# Patient Record
Sex: Male | Born: 1950 | Race: Black or African American | Hispanic: No | Smoking: Current every day smoker
Health system: Southern US, Community
[De-identification: ages and names within clinical notes are randomized; demographics above are authoritative.]

## PROBLEM LIST (undated history)

## (undated) ENCOUNTER — Emergency Department: Admission: EM | Payer: Medicare HMO | Source: Home / Self Care

## (undated) DIAGNOSIS — J449 Chronic obstructive pulmonary disease, unspecified: Secondary | ICD-10-CM

## (undated) DIAGNOSIS — M1990 Unspecified osteoarthritis, unspecified site: Secondary | ICD-10-CM

## (undated) DIAGNOSIS — I509 Heart failure, unspecified: Secondary | ICD-10-CM

## (undated) DIAGNOSIS — E785 Hyperlipidemia, unspecified: Secondary | ICD-10-CM

## (undated) DIAGNOSIS — I10 Essential (primary) hypertension: Secondary | ICD-10-CM

## (undated) DIAGNOSIS — N289 Disorder of kidney and ureter, unspecified: Secondary | ICD-10-CM

## (undated) HISTORY — PX: REPLACEMENT TOTAL KNEE: SUR1224

## (undated) HISTORY — PX: CORONARY ARTERY BYPASS GRAFT: SHX141

---

## 2011-03-16 ENCOUNTER — Emergency Department: Payer: Self-pay | Admitting: Emergency Medicine

## 2012-01-25 ENCOUNTER — Emergency Department: Payer: Self-pay | Admitting: Emergency Medicine

## 2012-03-28 ENCOUNTER — Encounter: Payer: Self-pay | Admitting: Anesthesiology

## 2012-03-29 ENCOUNTER — Encounter: Payer: Self-pay | Admitting: Anesthesiology

## 2012-04-05 ENCOUNTER — Ambulatory Visit: Payer: Self-pay | Admitting: Anesthesiology

## 2012-04-20 ENCOUNTER — Encounter: Payer: Self-pay | Admitting: Anesthesiology

## 2012-11-02 ENCOUNTER — Inpatient Hospital Stay: Payer: Self-pay | Admitting: Student

## 2012-11-02 DIAGNOSIS — R7989 Other specified abnormal findings of blood chemistry: Secondary | ICD-10-CM

## 2012-11-02 DIAGNOSIS — R079 Chest pain, unspecified: Secondary | ICD-10-CM

## 2012-11-02 DIAGNOSIS — I4892 Unspecified atrial flutter: Secondary | ICD-10-CM

## 2012-11-02 LAB — TROPONIN I
Troponin-I: 1.3 ng/mL — ABNORMAL HIGH
Troponin-I: 1.1 ng/mL — ABNORMAL HIGH

## 2012-11-02 LAB — CK TOTAL AND CKMB (NOT AT ARMC)
CK, Total: 178 U/L (ref 35–232)
CK, Total: 171 U/L (ref 35–232)
CK-MB: 2 ng/mL (ref 0.5–3.6)
CK-MB: 8.4 ng/mL — ABNORMAL HIGH (ref 0.5–3.6)

## 2012-11-02 LAB — COMPREHENSIVE METABOLIC PANEL
Albumin: 3.7 g/dL (ref 3.4–5.0)
Bilirubin,Total: 0.3 mg/dL (ref 0.2–1.0)
Co2: 27 mmol/L (ref 21–32)
Creatinine: 1.44 mg/dL — ABNORMAL HIGH (ref 0.60–1.30)
EGFR (African American): >60
EGFR (Non-African Amer.): 52 — ABNORMAL LOW
Osmolality: 285 (ref 275–301)
SGPT (ALT): 26 U/L (ref 12–78)
Total Protein: 7.3 g/dL (ref 6.4–8.2)

## 2012-11-02 LAB — PROTIME-INR
INR: 1
Prothrombin Time: 13.2 secs (ref 11.5–14.7)

## 2012-11-02 LAB — CBC
HCT: 41.8 % (ref 40.0–52.0)
MCHC: 34.7 g/dL (ref 32.0–36.0)
MCV: 83 fL (ref 80–100)
RBC: 5.01 10*6/uL (ref 4.40–5.90)
RDW: 14.1 % (ref 11.5–14.5)

## 2012-11-03 LAB — BASIC METABOLIC PANEL
Anion Gap: 2 — ABNORMAL LOW (ref 7–16)
BUN: 19 mg/dL — ABNORMAL HIGH (ref 7–18)
Calcium, Total: 8.9 mg/dL (ref 8.5–10.1)
Chloride: 108 mmol/L — ABNORMAL HIGH (ref 98–107)
Co2: 28 mmol/L (ref 21–32)
Creatinine: 1.26 mg/dL (ref 0.60–1.30)
EGFR (African American): >60
Glucose: 116 mg/dL — ABNORMAL HIGH (ref 65–99)
Osmolality: 279 (ref 275–301)

## 2012-11-03 LAB — CBC WITH DIFFERENTIAL/PLATELET
Basophil #: 0 10*3/uL (ref 0.0–0.1)
Eosinophil #: 0.2 10*3/uL (ref 0.0–0.7)
Eosinophil %: 2.7 %
Lymphocyte #: 2 10*3/uL (ref 1.0–3.6)
MCH: 28.9 pg (ref 26.0–34.0)
MCHC: 34.2 g/dL (ref 32.0–36.0)
Monocyte #: 0.4 x10 3/mm (ref 0.2–1.0)
Platelet: 202 10*3/uL (ref 150–440)
RBC: 4.31 10*6/uL — ABNORMAL LOW (ref 4.40–5.90)
RDW: 14.3 % (ref 11.5–14.5)
WBC: 6.6 10*3/uL (ref 3.8–10.6)

## 2013-04-27 ENCOUNTER — Emergency Department: Payer: Self-pay | Admitting: Emergency Medicine

## 2013-04-28 LAB — COMPREHENSIVE METABOLIC PANEL
ALBUMIN: 3.5 g/dL (ref 3.4–5.0)
ALK PHOS: 74 U/L
ALT: 28 U/L (ref 12–78)
AST: 22 U/L (ref 15–37)
Anion Gap: 5 — ABNORMAL LOW (ref 7–16)
BUN: 17 mg/dL (ref 7–18)
Bilirubin,Total: 0.3 mg/dL (ref 0.2–1.0)
CREATININE: 1.07 mg/dL (ref 0.60–1.30)
Calcium, Total: 9.2 mg/dL (ref 8.5–10.1)
Chloride: 110 mmol/L — ABNORMAL HIGH (ref 98–107)
Co2: 25 mmol/L (ref 21–32)
EGFR (African American): >60
Glucose: 132 mg/dL — ABNORMAL HIGH (ref 65–99)
OSMOLALITY: 283 (ref 275–301)
Potassium: 3.6 mmol/L (ref 3.5–5.1)
SODIUM: 140 mmol/L (ref 136–145)
TOTAL PROTEIN: 6.9 g/dL (ref 6.4–8.2)

## 2013-04-28 LAB — TROPONIN I: Troponin-I: <0.02 ng/mL

## 2013-04-28 LAB — CBC
HCT: 39.7 % — AB (ref 40.0–52.0)
HGB: 13.6 g/dL (ref 13.0–18.0)
MCH: 28.6 pg (ref 26.0–34.0)
MCHC: 34.3 g/dL (ref 32.0–36.0)
MCV: 83 fL (ref 80–100)
Platelet: 190 10*3/uL (ref 150–440)
RBC: 4.76 10*6/uL (ref 4.40–5.90)
RDW: 14.1 % (ref 11.5–14.5)
WBC: 7.3 10*3/uL (ref 3.8–10.6)

## 2013-09-12 ENCOUNTER — Emergency Department: Payer: Self-pay | Admitting: Emergency Medicine

## 2013-09-12 LAB — URINALYSIS, COMPLETE
Bilirubin,UR: NEGATIVE
Blood: NEGATIVE
GLUCOSE, UR: NEGATIVE mg/dL (ref 0–75)
KETONE: NEGATIVE
Leukocyte Esterase: NEGATIVE
NITRITE: NEGATIVE
Ph: 6 (ref 4.5–8.0)
RBC,UR: 3 /HPF (ref 0–5)
Specific Gravity: 1.03 (ref 1.003–1.030)
Squamous Epithelial: 1

## 2013-09-12 LAB — CBC WITH DIFFERENTIAL/PLATELET
BASOS ABS: 0.1 10*3/uL (ref 0.0–0.1)
Basophil %: 1.1 %
EOS ABS: 0.2 10*3/uL (ref 0.0–0.7)
Eosinophil %: 3.3 %
HCT: 40.5 % (ref 40.0–52.0)
HGB: 13.7 g/dL (ref 13.0–18.0)
LYMPHS ABS: 2.4 10*3/uL (ref 1.0–3.6)
Lymphocyte %: 35.3 %
MCH: 28.6 pg (ref 26.0–34.0)
MCHC: 33.8 g/dL (ref 32.0–36.0)
MCV: 85 fL (ref 80–100)
Monocyte #: 0.4 x10 3/mm (ref 0.2–1.0)
Monocyte %: 5.9 %
Neutrophil #: 3.7 10*3/uL (ref 1.4–6.5)
Neutrophil %: 54.4 %
Platelet: 207 10*3/uL (ref 150–440)
RBC: 4.78 10*6/uL (ref 4.40–5.90)
RDW: 13.7 % (ref 11.5–14.5)
WBC: 6.7 10*3/uL (ref 3.8–10.6)

## 2013-09-12 LAB — COMPREHENSIVE METABOLIC PANEL
AST: 16 U/L (ref 15–37)
Albumin: 3.7 g/dL (ref 3.4–5.0)
Alkaline Phosphatase: 68 U/L
Anion Gap: 8 (ref 7–16)
BILIRUBIN TOTAL: 0.3 mg/dL (ref 0.2–1.0)
BUN: 18 mg/dL (ref 7–18)
CALCIUM: 9.2 mg/dL (ref 8.5–10.1)
CO2: 24 mmol/L (ref 21–32)
Chloride: 109 mmol/L — ABNORMAL HIGH (ref 98–107)
Creatinine: 1.29 mg/dL (ref 0.60–1.30)
EGFR (Non-African Amer.): 59 — ABNORMAL LOW
Glucose: 176 mg/dL — ABNORMAL HIGH (ref 65–99)
Osmolality: 287 (ref 275–301)
Potassium: 3.4 mmol/L — ABNORMAL LOW (ref 3.5–5.1)
SGPT (ALT): 28 U/L (ref 12–78)
Sodium: 141 mmol/L (ref 136–145)
Total Protein: 7 g/dL (ref 6.4–8.2)

## 2013-10-01 ENCOUNTER — Emergency Department: Payer: Self-pay | Admitting: Emergency Medicine

## 2014-01-08 ENCOUNTER — Emergency Department: Payer: Self-pay | Admitting: Emergency Medicine

## 2014-01-08 LAB — COMPREHENSIVE METABOLIC PANEL
Albumin: 3.7 g/dL (ref 3.4–5.0)
Alkaline Phosphatase: 68 U/L
Anion Gap: 8 (ref 7–16)
BUN: 16 mg/dL (ref 7–18)
Bilirubin,Total: 0.4 mg/dL (ref 0.2–1.0)
CO2: 25 mmol/L (ref 21–32)
Calcium, Total: 8.9 mg/dL (ref 8.5–10.1)
Chloride: 111 mmol/L — ABNORMAL HIGH (ref 98–107)
Creatinine: 1.17 mg/dL (ref 0.60–1.30)
EGFR (Non-African Amer.): >60
GLUCOSE: 100 mg/dL — AB (ref 65–99)
Osmolality: 288 (ref 275–301)
Potassium: 3.6 mmol/L (ref 3.5–5.1)
SGOT(AST): 17 U/L (ref 15–37)
SGPT (ALT): 26 U/L
SODIUM: 144 mmol/L (ref 136–145)
TOTAL PROTEIN: 6.7 g/dL (ref 6.4–8.2)

## 2014-01-08 LAB — CBC
HCT: 40.2 % (ref 40.0–52.0)
HGB: 13.2 g/dL (ref 13.0–18.0)
MCH: 28 pg (ref 26.0–34.0)
MCHC: 32.8 g/dL (ref 32.0–36.0)
MCV: 85 fL (ref 80–100)
Platelet: 196 10*3/uL (ref 150–440)
RBC: 4.71 10*6/uL (ref 4.40–5.90)
RDW: 14.1 % (ref 11.5–14.5)
WBC: 7.7 10*3/uL (ref 3.8–10.6)

## 2014-01-08 LAB — URINALYSIS, COMPLETE
BACTERIA: NONE SEEN
BILIRUBIN, UR: NEGATIVE
Blood: NEGATIVE
Glucose,UR: NEGATIVE mg/dL (ref 0–75)
Ketone: NEGATIVE
LEUKOCYTE ESTERASE: NEGATIVE
NITRITE: NEGATIVE
PH: 5 (ref 4.5–8.0)
PROTEIN: NEGATIVE
RBC,UR: <1 /HPF (ref 0–5)
SPECIFIC GRAVITY: 1.023 (ref 1.003–1.030)

## 2014-02-02 ENCOUNTER — Encounter: Payer: Self-pay | Admitting: Family Medicine

## 2014-02-17 ENCOUNTER — Encounter: Payer: Self-pay | Admitting: Family Medicine

## 2014-03-11 ENCOUNTER — Emergency Department: Payer: Self-pay | Admitting: Emergency Medicine

## 2014-03-20 ENCOUNTER — Encounter: Payer: Self-pay | Admitting: Family Medicine

## 2014-07-10 NOTE — Consult Note (Signed)
General Aspect 64 year old male with significant past medical history of CRI, OSA,  hypertension, hyperlipidemia, COPD, history of DVT in the past, chronic chest pain per [facility] notes, who presents with palpitations and heart racing.Cardiology was consulted for atrial flutter.   He reports sx started last night with palps and fluttering, also some chest pain.   In the ER, the patient was found to be in atrial flutter  with RVR, with heart rate in the 150s. He was given IV metoprolol then started on IV Cardizem in the ED, with improvement of his heart rate.After a few hours on the drip in the ED, the patient converted back to normal sinus rhythm.   The patient was recently at [facility] for complaints of chest pain, where he had his troponin cycled, which was negative. The patient's last cardiac catheterization was in December 2012, with no significant CAD.  he received morphine in the ER. echo done in [facility], which showed ejection fraction 60% to 16% with diastolic dysfunction.   Present Illness . PAST SURGICAL HISTORY:  1. Bilateral total knee arthroplasty.  2. Coronary angioplasty with stent placement.   SOCIAL HISTORY: The patient smokes, 1 pack lasting over 4 days. No alcohol. No illicit drug use.   FAMILY HISTORY: Significant for diabetes and coronary artery disease.   Physical Exam:  GEN well developed, well nourished, no acute distress   HEENT hearing intact to voice, moist oral mucosa   NECK supple    RESP normal resp effort  clear BS    CARD Regular rate and rhythm  No murmur    ABD denies tenderness  soft    LYMPH negative neck   EXTR negative edema   SKIN normal to palpation   NEURO motor/sensory function intact   PSYCH alert, A+O to time, place, person, good insight   Review of Systems:  Subjective/Chief Complaint chest pain   Skin: No Complaints    ENT: No Complaints    Eyes: No Complaints    Neck: No Complaints    Respiratory: No Complaints     Cardiovascular: Chest pain or discomfort    Gastrointestinal: burping, burning, epigastric pain better with drinking water and peppermint    Genitourinary: No Complaints    Vascular: No Complaints    Musculoskeletal: No Complaints    Neurologic: No Complaints    Hematologic: No Complaints    Endocrine: No Complaints    Psychiatric: No Complaints    Review of Systems: All other systems were reviewed and found to be negative    Medications/Allergies Reviewed Medications/Allergies reviewed      Depression:    CHF:    GERD - Esophageal Reflux:    Asthma:    DVT - Deep Vein Thrombosis:    Hypertension:    irreg HR:    chronic arthritis:    card stent:    bilat TKR:        Admit Diagnosis:   ATRIAL FLUTTER: Onset Date: 02-Nov-2012, Status: Active, Description: ATRIAL FLUTTER  Home Medications: Medication Instructions Status  cyclobenzaprine 10 mg oral tablet 1 tab(s) orally 3 times a day, As Needed Active  atorvastatin 80 mg oral tablet 1 tab(s) orally once a day (at bedtime) Active  Ventolin HFA CFC free 90 mcg/inh inhalation aerosol 2 puff(s) inhaled 4 times a day Active  Vitamin D3 50,000 intl units oral capsule 1 cap(s) orally once a month Active  multivitamin  Active  Symbicort 80 mcg-4.5 mcg/inh inhalation aerosol 2 puff(s) inhaled 2 times  a day Active  hydrochlorothiazide-losartan 12.5 mg-50 mg oral tablet 1 tab(s) orally once a day Active  aspirin 81 mg oral tablet 1 tab(s) orally once a day Active  isosorbide mononitrate 30 mg oral tablet, extended release 1 tab(s) orally once a day (in the morning) Active  Nicoderm C-Q 21 mg/24 hr transdermal film, extended release 1 patch transdermal once a day Active  Cymbalta 60 mg oral delayed release capsule 1 cap(s) orally once a day Active  furosemide 40 mg oral tablet 1 tab(s) orally once a day Active  gentamicin ophthalmic 0.3% ophthalmic solution 1 drop(s) to each affected eye 4 times a day Active  Klor-Con  M20 20 mEq oral tablet, extended release 1 tab(s) orally 2 times a day Active  Metoprolol Tartrate 25 mg oral tablet 1 tab(s) orally 2 times a day Active  oxyCODONE 5 mg oral tablet 1 tab(s) orally every 6 hours Active  tamsulosin 0.4 mg oral capsule 1 cap(s) orally once a day Active  simvastatin 10 mg oral tablet 1 tab(s) orally once a day (at bedtime) Active  AndroGel 20.25 mg/1.25 g (1.62%) transdermal gel  transdermal  Active  Viagra 50 mg oral tablet 1 tab(s) orally once a day Active  Fish Oil  orally  Active   Lab Results:  Hepatic:  16-Aug-14 00:43   Bilirubin, Total 0.3  Alkaline Phosphatase 77  SGPT (ALT) 26  SGOT (AST) 22  Total Protein, Serum 7.3  Albumin, Serum 3.7  Routine Chem:  16-Aug-14 00:43   B-Type Natriuretic Peptide ([facility])  500 (Result(s) reported on 02 Nov 2012 at 01:23AM.)  Glucose, Serum  129  BUN  19  Creatinine (comp)  1.44  Sodium, Serum 141  Potassium, Serum  3.3  Chloride, Serum 107  CO2, Serum 27  Calcium (Total), Serum 9.0  Osmolality (calc) 285  eGFR (African American) >60  eGFR (Non-African American)  52 (eGFR values <52m/min/1.73 m2 may be an indication of chronic kidney disease (CKD). Calculated eGFR is useful in patients with stable renal function. The eGFR calculation will not be reliable in acutely ill patients when serum creatinine is changing rapidly. It is not useful in  patients on dialysis. The eGFR calculation may not be applicable to patients at the low and high extremes of body sizes, pregnant women, and vegetarians.)  Anion Gap 7  Cardiac:  16-Aug-14 00:43   CK, Total 186  CPK-MB, Serum 2.0 (Result(s) reported on 02 Nov 2012 at 01:42AM.)  Troponin I 0.03 (0.00-0.05 0.05 ng/mL or less: NEGATIVE  Repeat testing in 3-6 hrs  if clinically indicated. >0.05 ng/mL: POTENTIAL  MYOCARDIAL INJURY. Repeat  testing in 3-6 hrs if  clinically indicated. NOTE: An increase or decrease  of 30% or more on serial  testing suggests a   clinically important change)    09:08   Troponin I  1.30 (0.00-0.05 0.05 ng/mL or less: NEGATIVE  Repeat testing in 3-6 hrs  if clinically indicated. >0.05 ng/mL: POTENTIAL  MYOCARDIAL INJURY. Repeat  testing in 3-6 hrs if  clinically indicated. NOTE: An increase or decrease  of 30% or more on serial  testing suggests a  clinically important change)  Routine Coag:  16-Aug-14 00:43   Prothrombin 13.2  INR 1.0 (INR reference interval applies to patients on anticoagulant therapy. A single INR therapeutic range for coumarins is not optimal for all indications; however, the suggested range for most indications is 2.0 - 3.0. Exceptions to the INR Reference Range may include: Prosthetic heart valves, acute myocardial  infarction, prevention of myocardial infarction, and combinations of aspirin and anticoagulant. The need for a higher or lower target INR must be assessed individually. Reference: The Pharmacology and Management of the Vitamin K  antagonists: the seventh ACCP Conference on Antithrombotic and Thrombolytic Therapy. LKGMW.1027 Sept:126 (3suppl): N9146842. A HCT value >55% may artifactually increase the PT.  In one study,  the increase was an average of 25%. Reference:  "Effect on Routine and Special Coagulation Testing Values of Citrate Anticoagulant Adjustment in Patients with High HCT Values." American Journal of Clinical Pathology 2006;126:400-405.)  Routine Hem:  16-Aug-14 00:43   WBC (CBC) 8.5  RBC (CBC) 5.01  Hemoglobin (CBC) 14.5  Hematocrit (CBC) 41.8  Platelet Count (CBC) 232 (Result(s) reported on 02 Nov 2012 at 12:53AM.)  MCV 83  MCH 28.9  MCHC 34.7  RDW 14.1   EKG:  Interpretation EKG shows atrial flutter with ventricular rate of 152 bpm, no significant ST or T wave changes   Radiology Results: XRay:    16-Aug-14 00:54, Chest Portable Single View  Chest Portable Single View   REASON FOR EXAM:    chest pain  COMMENTS:       PROCEDURE: DXR - DXR  PORTABLE CHEST SINGLE VIEW  - Nov 02 2012 12:54AM     RESULT: Comparison is made to study of 03/16/2011. The cardiac monitoring   electrodes present. The lungs are clear. The heart and pulmonary vessels   are normal. The bony and mediastinal structures are unremarkable. There   is no effusion. There is no pneumothorax or evidence of congestive   failure.    IMPRESSION:  No acute cardiopulmonary disease.    Dictation Site: 6    Verified By: Sundra Aland, M.D., MD    No Known Allergies:    Impression 64 year old male with significant past medical history of CRI, OSA,  hypertension, hyperlipidemia, COPD, history of DVT in the past, chronic chest pain per [facility] notes, who presents with palpitations and heart racing.Cardiology was consulted for atrial flutter.   1) Atrial flutter Suspect triggered by chest pain in setting of low potassium Now in NSR would continue metoprolol if heart rate tolerates, hold parameters Hold on anticoagulation for now and monitor echo pending  2) Chest/epigastric pain chronic issue, dating back for years,  atypical in nature, better in the past with drinking, peppermint this was the reason for cardiac cath in 12/12 at [facility] (no CAD) Never had EGD or GI workup per the patient Current having  pain, moving around left side of chest,  --Wil give GI cocktail, start PPI --Add D-dimer (rule out PE given hx of DVT)  3) Elevated cardiac enz: Demand ischemia from atrial flutter with rate 150 on arrival for hours Previous cardiac cath no no significant CAD No anticoagulation at this time will repeat EKG now, monitor enz If cardiac enz continue to trend upwards with no atrial flutter, will need repeat cath  4) OSA:  5)HTN: Stable, low potassium from lasix   Electronic Signatures: Ida Rogue (MD)  (Signed 16-Aug-14 12:34)  Authored: General Aspect/Present Illness, History and Physical Exam, Review of System, Past Medical History, Health Issues, Home  Medications, Labs, EKG , Radiology, Allergies, Impression/Plan   Last Updated: 16-Aug-14 12:34 by Ida Rogue (MD)

## 2014-07-10 NOTE — H&P (Signed)
PATIENT NAME:  Albert Johnson, Albert Johnson MR#:  [id number] DATE OF BIRTH:  04/18/1950  DATE OF ADMISSION:  11/02/2012  REFERRING PHYSICIAN: Doralee Albino. Ladona Ridgel, MD  PRIMARY CARE PHYSICIAN: [facility].   CHIEF COMPLAINT: Palpitations and chest pain.   HISTORY OF PRESENT ILLNESS: This is a 64 year old male with significant past medical history of coronary artery disease, hypertension, hyperlipidemia, obstructive sleep apnea, COPD, history of DVT in the past, who presents with above-mentioned complaint. The patient reports he had palpitations and heart racing that started this evening. As well, he has complaint of some chest pain. The patient was found to be in atrial flutter and atrial fibrillation with RVR, with heart rate in the 150s. He was given IV metoprolol without much help, so he was started on IV Cardizem in the ED, with improvement of his heart rate, where it was rate controlled, and after a few hours on the drip in the ED, the patient converted back to normal sinus rhythm. As well, he did have some bradycardia when he had his Cardizem drip started. The patient was recently at [facility] for complaints of chest pain, where he had his troponin cycled, which was negative. The patient's last cardiac catheterization was in December 2012, which was without any evidence of angiographic coronary artery disease. Currently, the patient reports he is chest pain-free after receiving the morphine. He received 324 of aspirin in ED. First troponin is negative. The patient had last echo done in [facility], which showed ejection fraction 60% to 65% and with diastolic dysfunction. Currently, the patient is chest pain-free after receiving the morphine. He did have some complaints of reproducible chest pain upon palpation, but he reports this is different than the chest pain he presented with. Hospitalist service was requested to admit the patient for further evaluation for his new-onset atrial fibrillation, as well for his  chest pain.   PAST MEDICAL HISTORY:  1. Hypertension.  2. Benign prostatic hypertrophy.  3. Testicular hypofunction.  4. Obstructive sleep apnea, on CPAP.  5. Chronic kidney disease.  6. Gastroesophageal reflux disease.  7. Chronic obstructive pulmonary disease.  8. Depression.  9. History of DVT.  10. Coronary artery disease.   PAST SURGICAL HISTORY:  1. Bilateral total knee arthroplasty.  2. Coronary angioplasty with stent placement.   SOCIAL HISTORY: The patient smokes, 1 pack lasting over 4 days. No alcohol. No illicit drug use.   FAMILY HISTORY: Significant for diabetes and coronary artery disease.   HOME MEDICATIONS:  1. Aspirin 81 mg daily.  2. Atorvastatin 80 mg at bedtime.  3. Isosorbide mononitrate 30 mg oral daily.  4. Nicotine patch daily.  5. Sublingual nitroglycerin as needed.  6. Albuterol as needed.  7. Vitamin D3 50,000 units capsule.  8. Flexeril as needed.  9. Cymbalta 60 mg oral daily.  10. Lasix 40 mg oral daily.  11. Ophthalmic solution gentamicin.  12. Potassium 20 mEq oral daily.  13. Losartan/hydrochlorothiazide 12.5/50 mg oral daily.  14. Metoprolol tartrate 25 mg oral daily.  15. Multivitamin 1 tablet oral daily.  16. Oxycodone 5 mg immediate release as needed.  17. Symbicort 80/4.5 two puffs b.i.d.  18. Tamsulosin 0.4 mg oral daily.   REVIEW OF SYSTEMS:  CONSTITUTIONAL: The patient denies fever, chills, fatigue, weakness, weight gain, weight loss.  EYES: Denies blurry vision, double vision, inflammation, glaucoma.  ENT: Denies tinnitus, ear pain, hearing loss, epistaxis or discharge.  RESPIRATORY: Denies cough, wheezing, hemoptysis, dyspnea. Has history of COPD.  CARDIOVASCULAR: Complains of chest  pain, currently chest pain-free. Denies edema or syncope. Complains of palpitations and heart racing. GASTROINTESTINAL: Denies nausea, vomiting, diarrhea, abdominal pain, jaundice, rectal bleed.  GENITOURINARY: Denies dysuria, hematuria or renal  colic.  ENDOCRINE: Denies polyuria, polydipsia, heat or cold intolerance.  HEMATOLOGY: Denies anemia, easy bruising, bleeding diathesis.  INTEGUMENTARY: Denies acne, rash or skin lesions.  MUSCULOSKELETAL: Denies any gout, cramps. Has history of arthritis.  NEUROLOGIC: Denies CVA, TIA, ataxia, dementia, headache.  PSYCHIATRIC: Denies anxiety, schizophrenia, nervousness, substance or alcohol abuse or bipolar disorder.   PHYSICAL EXAMINATION:  VITAL SIGNS: Temperature 98.8, pulse 61, respiratory rate 16, blood pressure 104/54, saturating 100% on oxygen.  GENERAL: An obese male who looks comfortable in bed, in no apparent distress.  HEENT: Head atraumatic, normocephalic. Pupils equally reactive to light. Pink conjunctivae. Anicteric sclerae. Moist oral mucosa.  NECK: Supple. No thyromegaly. No JVD.  CHEST: Good air entry bilaterally. No wheezing, rales, rhonchi.  CARDIOVASCULAR: S1, S2 heard. No rubs, murmurs or gallops.  ABDOMEN: Obese, soft, nontender, nondistended. Bowel sounds present.  EXTREMITIES: No edema. No clubbing. No cyanosis. Pulses felt bilaterally +2.  SKIN: Normal skin turgor. Warm and dry. No rash.  PSYCHIATRIC: Appropriate affect. Awake and alert x3. Intact judgment and insight.  NEUROLOGIC: Cranial nerves grossly intact. Motor 5 out of 5. No focal deficits.  LYMPHATICS: No cervical or supraclavicular lymphadenopathy.   PERTINENT LABORATORY DATA: Glucose 129. BNP 500. BUN 19, creatinine 1.44, sodium 141, potassium 3.3, chloride 107, CO2 27. Troponin 0.03. White blood cell 8.5, hemoglobin 14.5, hematocrit 41.8, platelets 232. INR is 1.   ELECTROCARDIOGRAM: Showing atrial flutter with 152 beats per minute.   ASSESSMENT AND PLAN:  1. Chest pain. The patient was given 324 mg of aspirin, was given nitro paste. Currently, he is chest pain-free. The patient will be admitted to telemetry unit. Will cycle his cardiac enzymes. He is already on aspirin and statin, beta blockers and  losartan.  2. Atrial flutter. The patient subsequently converted back to normal sinus rhythm, off Cardizem drip. Will consult cardiology. Will continue to monitor on telemetry and continue to cycle his cardiac enzymes. Would like to obtain cardiac evaluation to see if the patient is a candidate for anticoagulation or if he needs to be started on any other medication for his atrial flutter.  3. Obstructive sleep apnea. Continue with CPAP.  4. Hypertension. Blood pressure acceptable. Continue home medications. Was on the lower side secondary to his Cardizem drip, which we discontinued right now.  5. Tobacco abuse. The patient was counseled for five minutes. Will continue on NicoDerm patch.  6. Chronic kidney disease. Will monitor closely. Will avoid nephrotoxic medication.  7. Chronic obstructive pulmonary disease. Does not have any wheezing. Appears to be compensated. Will continue him on his home medications, mainly Symbicort and albuterol.  8. Coronary artery disease. The patient is on aspirin, statin, beta blockers, Imdur and losartan. 9. Deep vein thrombosis prophylaxis. Subcutaneous heparin.   CODE STATUS: Full code.   TOTAL TIME SPENT ON ADMISSION AND PATIENT CARE: 60 minutes.   ____________________________ Starleen Armsawood S. Randie Tallarico, MD dse:OSi D: 11/02/2012 07:11:12 ET T: 11/02/2012 07:25:32 ET JOB#: [id number]  cc: Starleen Armsawood S. Waldemar Siegel, MD, <Dictator> Marshae Azam Teena IraniS Hayven Fatima MD ELECTRONICALLY SIGNED 11/09/2012 12:10

## 2014-07-10 NOTE — Discharge Summary (Signed)
PATIENT NAME:  Juanetta BeetsSNIPES, Dreon MR#:  [id number] DATE OF BIRTH:  [birth year]  DATE OF ADMISSION:  11/02/2012 DATE OF DISCHARGE:  11/04/2012  CONSULTANTS: Dr. Dossie Arbourim Gollan from cardiology.   [facility]   CHIEF COMPLAINT: Palpitations.   DISCHARGE DIAGNOSES:  1.  Atrial flutter, likely in the setting of hypokalemia, now resolved.  2.  Hypertension.  3.  Hyperkalemia.  4.  Chest/epigastric pain, likely gastrointestinal related.  5.  Elevated cardiac enzymes, likely demand ischemia from atrial flutter with tachycardia.  6.  Obstructive sleep apnea.  7.  Hypertension.  8.  History of chronic obstructive pulmonary disease.  9.  History of deep vein thrombosis in the past.  10. Benign prostatic hypertrophy.  11. Testicular hypofunction.  12. Chronic kidney disease.  13. Gastroesophageal reflux disease.  14. Depression.  15. History of coronary artery disease.   DISCHARGE MEDICATIONS: Fish oil orally as previously used, cyclobenzaprine 10 mg 1 tab 3 times a day as needed, Ventolin 2 puffs 4 times a day, vitamin D3 50,000 international units once a month, Symbicort 80/4.5 mcg 2 puffs 2 times a day, hydrochlorothiazide/losartan 12.5/50 mg 1 tab once a day, aspirin 81 mg daily, Nicoderm 21 mg per day extended release, 1 patch transdermally daily, Cymbalta 60 mg daily, furosemide 40 mg daily, gentamicin ophthalmic 0.3% solution 1 drop to each affected eye 4 times a day, tamsulosin 0.4 mg daily, simvastatin 10 mg daily, AndroGel 20.25 per 1.25 grams transdermally and Imdur 30 mg in the morning.   DIET: Low sodium, low fat, low cholesterol.   ACTIVITY: As tolerated.   DISCHARGE FOLLOWUP: Please follow with PCP within 1 to 2 weeks.   DISPOSITION: Home.   HISTORY OF PRESENT ILLNESS AND HOSPITAL COURSE: For full details of H and P, please see the dictation on 08/16 by Dr. Randol KernElgergawy, but briefly this is an obese, 64 year old, African American male with history of  CAD, hypertension, hyperlipidemia, COPD, obstructive sleep apnea, who presented for palpitations. In the ER, he was found to be in fib. and flutter with rapid ventricular rate with heart rate of 150s and was given IV metoprolol and then Cardizem. He did convert to normal sinus rhythm and was admitted to telemetry. Of note, he has had a cardiac catheterization in 02/2011, which was without any evidence of  CAD per H and P. He was admitted to the hospitalist service and telemetry. Troponins were cycled and a second Troponin did jump. This was likely tachycardia driven. The patient was seen by Dr. Mariah MillingGollan from cardiology. Atrial flutter did not come back and he was monitored on telemetry without further tachycardia. He was on metoprolol 25 mg b.i.d. at discharge. He has hypokalemia, which per cardiology likely triggered the A. flutter was repleted and he was discharged on potassium. His blood pressure remained stable. His initial BUN was on 19, creatinine of 1.44 and on 08/17 creatinine was 1.26. He did not have any significant leukocytosis on arrival nor did he have a fever. His d-dimer was checked and it was 0.45, which made a DVT or PE less likely. He did have some chest/epigastric pain, which appears to be chronic dating back for some time. This is atypical and as he has had no EGD or GI workup in the past, he was instructed to follow up with his Doctor and undergo further evaluation for this. He was maintained on CPAP. At this time, he will be discharged to outpatient followup. On the day of discharge, his temperature  was 98, pulse rate was 55, respiratory rate was 18, blood pressure was 152/82 and his saturation was 97% on room air. Generally, the patient is an obese, African American male lying in bed, but has been ambulating in the halls without any issues. Heart sounds appear to be her regular, but bradycardic. No murmurs, rubs or gallops. Lung sounds are clear.  The abdomen is benign without any significant  tenderness and normoactive. He does not have any lower extremity edema. At this time, he will be discharged with outpatient followup.   TOTAL TIME SPENT: 35 minutes.  ____________________________ Krystal Eaton, MD sa:aw D: 11/04/2012 14:52:36 ET T: 11/04/2012 15:37:03 ET JOB#: [id number]  cc: Krystal Eaton, MD, <Dictator> Krystal Eaton MD ELECTRONICALLY SIGNED 11/10/2012 12:44

## 2015-09-30 ENCOUNTER — Emergency Department (HOSPITAL_COMMUNITY): Payer: Medicare HMO

## 2015-09-30 ENCOUNTER — Encounter (HOSPITAL_COMMUNITY): Payer: Self-pay | Admitting: *Deleted

## 2015-09-30 ENCOUNTER — Emergency Department (HOSPITAL_COMMUNITY)
Admission: EM | Admit: 2015-09-30 | Discharge: 2015-10-01 | Disposition: A | Discharge status: Home / Self Care | Payer: Medicare HMO | Attending: Emergency Medicine | Admitting: Emergency Medicine

## 2015-09-30 DIAGNOSIS — Z951 Presence of aortocoronary bypass graft: Secondary | ICD-10-CM | POA: Insufficient documentation

## 2015-09-30 DIAGNOSIS — I110 Hypertensive heart disease with heart failure: Secondary | ICD-10-CM | POA: Insufficient documentation

## 2015-09-30 DIAGNOSIS — J449 Chronic obstructive pulmonary disease, unspecified: Secondary | ICD-10-CM | POA: Diagnosis not present

## 2015-09-30 DIAGNOSIS — R079 Chest pain, unspecified: Secondary | ICD-10-CM | POA: Diagnosis present

## 2015-09-30 DIAGNOSIS — I509 Heart failure, unspecified: Secondary | ICD-10-CM | POA: Diagnosis not present

## 2015-09-30 DIAGNOSIS — Z96659 Presence of unspecified artificial knee joint: Secondary | ICD-10-CM | POA: Diagnosis not present

## 2015-09-30 DIAGNOSIS — F17210 Nicotine dependence, cigarettes, uncomplicated: Secondary | ICD-10-CM | POA: Diagnosis not present

## 2015-09-30 DIAGNOSIS — R05 Cough: Secondary | ICD-10-CM

## 2015-09-30 DIAGNOSIS — R059 Cough, unspecified: Secondary | ICD-10-CM

## 2015-09-30 DIAGNOSIS — R748 Abnormal levels of other serum enzymes: Secondary | ICD-10-CM | POA: Diagnosis not present

## 2015-09-30 DIAGNOSIS — R7989 Other specified abnormal findings of blood chemistry: Secondary | ICD-10-CM

## 2015-09-30 HISTORY — DX: Chronic obstructive pulmonary disease, unspecified: J44.9

## 2015-09-30 HISTORY — DX: Unspecified osteoarthritis, unspecified site: M19.90

## 2015-09-30 HISTORY — DX: Hyperlipidemia, unspecified: E78.5

## 2015-09-30 HISTORY — DX: Heart failure, unspecified: I50.9

## 2015-09-30 HISTORY — DX: Essential (primary) hypertension: I10

## 2015-09-30 HISTORY — DX: Disorder of kidney and ureter, unspecified: N28.9

## 2015-09-30 LAB — COMPREHENSIVE METABOLIC PANEL
ALT: 23 U/L (ref 17–63)
AST: 25 U/L (ref 15–41)
Albumin: 3.6 g/dL (ref 3.5–5.0)
Alkaline Phosphatase: 67 U/L (ref 38–126)
Anion gap: 7 (ref 5–15)
BUN: 14 mg/dL (ref 6–20)
CHLORIDE: 105 mmol/L (ref 101–111)
CO2: 22 mmol/L (ref 22–32)
Calcium: 8.8 mg/dL — ABNORMAL LOW (ref 8.9–10.3)
Creatinine, Ser: 1.41 mg/dL — ABNORMAL HIGH (ref 0.61–1.24)
GFR calc Af Amer: 60 mL/min — ABNORMAL LOW (ref >60)
GFR, EST NON AFRICAN AMERICAN: 52 mL/min — AB (ref >60)
Glucose, Bld: 115 mg/dL — ABNORMAL HIGH (ref 65–99)
POTASSIUM: 3.5 mmol/L (ref 3.5–5.1)
Sodium: 134 mmol/L — ABNORMAL LOW (ref 135–145)
TOTAL PROTEIN: 6.3 g/dL — AB (ref 6.5–8.1)
Total Bilirubin: 1.1 mg/dL (ref 0.3–1.2)

## 2015-09-30 LAB — CBC
HEMATOCRIT: 42 % (ref 39.0–52.0)
HEMOGLOBIN: 13.9 g/dL (ref 13.0–17.0)
MCH: 27.9 pg (ref 26.0–34.0)
MCHC: 33.1 g/dL (ref 30.0–36.0)
MCV: 84.2 fL (ref 78.0–100.0)
Platelets: 185 10*3/uL (ref 150–400)
RBC: 4.99 MIL/uL (ref 4.22–5.81)
RDW: 14.2 % (ref 11.5–15.5)
WBC: 5.5 10*3/uL (ref 4.0–10.5)

## 2015-09-30 LAB — URINALYSIS, ROUTINE W REFLEX MICROSCOPIC
Bilirubin Urine: NEGATIVE
GLUCOSE, UA: NEGATIVE mg/dL
Hgb urine dipstick: NEGATIVE
KETONES UR: NEGATIVE mg/dL
LEUKOCYTES UA: NEGATIVE
Nitrite: NEGATIVE
PH: 5.5 (ref 5.0–8.0)
Protein, ur: 30 mg/dL — AB
SPECIFIC GRAVITY, URINE: 1.026 (ref 1.005–1.030)

## 2015-09-30 LAB — URINE MICROSCOPIC-ADD ON: RBC / HPF: NONE SEEN RBC/hpf (ref 0–5)

## 2015-09-30 LAB — LIPASE, BLOOD: LIPASE: 31 U/L (ref 11–51)

## 2015-09-30 LAB — TROPONIN I: TROPONIN I: 0.03 ng/mL — AB (ref <0.03)

## 2015-09-30 MED ORDER — ALBUTEROL SULFATE (2.5 MG/3ML) 0.083% IN NEBU
2.5000 mg | INHALATION_SOLUTION | Freq: Once | RESPIRATORY_TRACT | Status: AC
Start: 2015-09-30 — End: 2015-09-30
  Administered 2015-09-30: 2.5 mg via RESPIRATORY_TRACT
  Filled 2015-09-30: qty 3

## 2015-09-30 MED ORDER — ALBUTEROL SULFATE (2.5 MG/3ML) 0.083% IN NEBU
INHALATION_SOLUTION | RESPIRATORY_TRACT | Status: AC
  Filled 2015-09-30: qty 3

## 2015-09-30 MED ORDER — HYDROCODONE-HOMATROPINE 5-1.5 MG/5ML PO SYRP
5.0000 mL | ORAL_SOLUTION | Freq: Once | ORAL | Status: AC
  Administered 2015-09-30: 5 mL via ORAL
  Filled 2015-09-30: qty 5

## 2015-09-30 MED ORDER — IPRATROPIUM BROMIDE 0.02 % IN SOLN
RESPIRATORY_TRACT | Status: AC
  Filled 2015-09-30: qty 2.5

## 2015-09-30 MED ORDER — SODIUM CHLORIDE 0.9 % IV SOLN
Freq: Once | INTRAVENOUS | Status: AC
  Administered 2015-09-30: 22:00:00 via INTRAVENOUS

## 2015-09-30 MED ORDER — IPRATROPIUM BROMIDE 0.02 % IN SOLN
0.5000 mg | Freq: Once | RESPIRATORY_TRACT | Status: AC
  Administered 2015-09-30: 0.5 mg via RESPIRATORY_TRACT
  Filled 2015-09-30: qty 2.5

## 2015-09-30 NOTE — ED Provider Notes (Signed)
CSN: [id number]     Arrival date & time 09/30/15  1831 History   First MD Initiated Contact with Patient 09/30/15 2105     Chief Complaint  Patient presents with  . Abdominal Pain  . Emesis  . Cough     (Consider location/radiation/quality/duration/timing/severity/associated sxs/prior Treatment) HPI Comments: 65 year old male with a history of COPD, hypertension, hyperlipidemia, CAD s/p CABG and CHF presents to the emergency department for evaluation of a cough. Patient states that he had difficulty sleeping all night last night because of his cough which kept him awake. He noticed that the cough persisted throughout the day today and he feels as though it is getting worse. Cough was initially productive of clear sputum. He now believes that the sputum is yellow in color. He does report noncompliance with his Symbicort and albuterol for COPD. He denies any fevers or recent sick contacts. No nasal congestion or rhinorrhea. No medications taken prior to arrival to try and alleviate cough. Patient has had 3 episodes of posttussive emesis as a result of filing coughing spells. He complains of some chest pain which is present with coughing and deep breathing. He also has some mild abdominal pain with forceful coughing. No associated diarrhea. No syncope. No hemoptysis.  The history is provided by the patient. No language interpreter was used.    Past Medical History  Diagnosis Date  . Hypertension   . Hyperlipidemia   . Arthritis   . COPD (chronic obstructive pulmonary disease) (HCC)   . Renal disorder   . CHF (congestive heart failure) [facility])    Past Surgical History  Procedure Laterality Date  . Replacement total knee    . Coronary artery bypass graft     No family history on file. Social History  Substance Use Topics  . Smoking status: Current Every Day Smoker -- 0.50 packs/day    Types: Cigarettes  . Smokeless tobacco: None  . Alcohol Use: No    Review of Systems   Constitutional: Negative for fever.  HENT: Negative for congestion and rhinorrhea.   Respiratory: Positive for cough and shortness of breath.   Cardiovascular: Positive for chest pain.  Gastrointestinal: Positive for vomiting and abdominal pain. Negative for nausea.  Neurological: Negative for syncope.  Ten systems reviewed and are negative for acute change, except as noted in the HPI.    Allergies  Review of patient's allergies indicates no known allergies.  Home Medications   Prior to Admission medications   Not on File   BP 160/81 mmHg  Pulse 67  Temp(Src) 99.5 F (37.5 C) (Oral)  Resp 21  Ht 6' (1.829 m)  Wt 129.275 kg  BMI 38.64 kg/m2  SpO2 93%   Physical Exam  Constitutional: He is oriented to person, place, and time. He appears well-developed and well-nourished. No distress.  Nontoxic appearing  HENT:  Head: Normocephalic and atraumatic.  Eyes: Conjunctivae and EOM are normal. No scleral icterus.  Neck: Normal range of motion.  Cardiovascular: Normal rate, regular rhythm and intact distal pulses.   Pulmonary/Chest: Effort normal. No respiratory distress. He has no wheezes.  Respirations even and unlabored. Dry, nonproductive cough appreciated. No tachypnea or dyspnea. No wheezing, rales, or rhonchi.  Musculoskeletal: Normal range of motion.  Trace pitting edema in BLE  Neurological: He is alert and oriented to person, place, and time. He exhibits normal muscle tone. Coordination normal.  GCS 15. Patient moving all extremities.  Skin: Skin is warm and dry. No rash noted. He is not  diaphoretic. No erythema. No pallor.  Psychiatric: He has a normal mood and affect. His behavior is normal.  Nursing note and vitals reviewed.   ED Course  Procedures (including critical care time) Labs Review Labs Reviewed  COMPREHENSIVE METABOLIC PANEL - Abnormal; Notable for the following:    Sodium 134 (*)    Glucose, Bld 115 (*)    Creatinine, Ser 1.41 (*)    Calcium 8.8  (*)    Total Protein 6.3 (*)    GFR calc non Af Amer 52 (*)    GFR calc Af Amer 60 (*)    All other components within normal limits  URINALYSIS, ROUTINE W REFLEX MICROSCOPIC (NOT AT [facility]) - Abnormal; Notable for the following:    Color, Urine AMBER (*)    Protein, ur 30 (*)    All other components within normal limits  URINE MICROSCOPIC-ADD ON - Abnormal; Notable for the following:    Squamous Epithelial / LPF 0-5 (*)    Bacteria, UA FEW (*)    Casts HYALINE CASTS (*)    All other components within normal limits  TROPONIN I - Abnormal; Notable for the following:    Troponin I 0.03 (*)    All other components within normal limits  BRAIN NATRIURETIC PEPTIDE - Abnormal; Notable for the following:    B Natriuretic Peptide 111.3 (*)    All other components within normal limits  LIPASE, BLOOD  CBC  TROPONIN I    Imaging Review Dg Chest 2 View  09/30/2015  CLINICAL DATA:  65 year old male with cough and shortness of breath EXAM: CHEST  2 VIEW COMPARISON:  Radiograph dated 03/11/2014 FINDINGS: Two two views of the chest demonstrate stable mild cardiomegaly with mild central vascular prominence compatible with mild congestive changes. There is no focal consolidation, pleural effusion, or pneumothorax. No acute osseous pathology. IMPRESSION: Stable mild cardiomegaly with mild congestive changes. No focal consolidation. Electronically Signed   By: Elgie Collard M.D.   On: 09/30/2015 22:36     I have personally reviewed and evaluated these images and lab results as part of my medical decision-making.   EKG Interpretation   Date/Time:  Thursday September 30 2015 21:47:37 EDT Ventricular Rate:  75 PR Interval:    QRS Duration: 89 QT Interval:  401 QTC Calculation: 448 R Axis:   74 Text Interpretation:  Sinus rhythm Probable left atrial enlargement RSR'  in V1 or V2, probably normal variant Probable LVH with secondary repol  abnrm ST elevation, consider inferior injury Baseline wander in  lead(s) V2  When compared to tracing from Apr 27, 2013 similar t wave inversions in  lateral leads and similar ST segment elevations  although basline wander  is present Confirmed by NGUYEN, EMILY ([id number]) on 09/30/2015 10:12:34 PM       12:10 AM Case discussed with cardiology fellow including the patient's symptoms, chest x-ray findings, EKG changes, and troponin 0.03. Symptoms, in my opinion, seem most c/w mild CHF exacerbation; potentially also aggravating his COPD which the patient has neglected to manage with his Symbicort and Albuterol. Cardiology believes that the patient is stable enough for outpatient diuresis with initiation of 20 mg Lasix PO QD. cardiology recommends that IV Lasix be given in the emergency department and to trend the patient's troponin level to ensure no worsening of this. Case discussed with the patient who verbalizes understanding and is agreeable to plan. I have advised that he follow up with his outpatient [facility] cardiologist. He states he has a  follow up scheduled in 1 week.  1:35 AM Delta troponin stable. Vitals reassuring. Will proceed with discharge and outpatient follow up. Of note, Lisinopril is listed on patient's home medication list; however this is NOT listed on home medications from office follow up with his Cardiologist and PCP in May and June 2017, respectively. Doubt ACE induced cough.  MDM   Final diagnoses:  Cough  Acute on chronic congestive heart failure, unspecified congestive heart failure type (HCC)  Elevated serum creatinine    65 year old male presents to the emergency department for evaluation of cough and the absence of fever and other upper respiratory symptoms. He also reports feeling slightly short of breath. Patient complaining of chest pain and abdominal pain; however, these are in the setting of coughing. He also experienced 3 episodes of posttussive emesis prior to arrival. No reported sick contacts. No associated syncope.  Patient  with fairly clear lung sounds on initial exam. He does report noncompliance with his COPD medications which may be partially responsible for his worsening shortness of breath. Dry, nonproductive cough noted at bedside. Patient states that cough has been slightly productive at home. DuoNeb treatment ordered as well as Hycodan for cough. Patient responded well to this and states that he has noted some improvement in his symptoms.  Chest x-ray today reveals stable, mild cardiomegaly. No focal consolidation to suggest PNA. No pleural effusion or PTX. There are some mild congestive changes on CXR. BNP is slightly elevated today and patient noted to have trace pitting edema in bilateral lower extremities. Question whether symptom onset may be, in part, due to mild CHF exacerbation. Patient is not currently on any diuretic.  Troponin was obtained and found to be elevated with undetermined significance. Troponin level was trended and found to be stable. Case discussed with cardiology who recommends low-dose diuresis on an outpatient basis. [facility] and he has been instructed to follow-up with his heart doctor regarding today's visit. He states he has an appointment scheduled in 1 week. PCP follow up also advised and return precautions given. Patient discharged in satisfactory condition with no unaddressed concerns.   Filed Vitals:   09/30/15 2345 10/01/15 0000 10/01/15 0030 10/01/15 0100  BP: 152/72 165/88 160/81 179/82  Pulse: 70 67 67 65  Temp:      TempSrc:      Resp: 23 19 21 21   Height:      Weight:      SpO2: 95% 95% 93% 95%     Antony MaduraKelly Aaliyan Brinkmeier, PA-C 10/01/15 0539  Leta BaptistEmily Roe Nguyen, MD 10/11/15 [phone]

## 2015-09-30 NOTE — ED Notes (Signed)
Ptc/o cough since yesterday that is making him vomit. Also c/o abd pain. Emesis x 3. Denies diarrhea. Last BM 2 days ago.

## 2015-09-30 NOTE — ED Notes (Signed)
Trop critical reported to Dr. Cyndie ChimeNguyen

## 2015-10-01 LAB — BRAIN NATRIURETIC PEPTIDE: B NATRIURETIC PEPTIDE 5: 111.3 pg/mL — AB (ref 0.0–100.0)

## 2015-10-01 LAB — TROPONIN I: TROPONIN I: 0.03 ng/mL — AB (ref <0.03)

## 2015-10-01 MED ORDER — FUROSEMIDE 20 MG PO TABS: 20.0000 mg | ORAL_TABLET | Freq: Every day | ORAL | Status: DC

## 2015-10-01 MED ORDER — BENZONATATE 100 MG PO CAPS: 100.0000 mg | ORAL_CAPSULE | Freq: Three times a day (TID) | ORAL | Status: DC | PRN

## 2015-10-01 MED ORDER — FUROSEMIDE 10 MG/ML IJ SOLN
20.0000 mg | Freq: Once | INTRAMUSCULAR | Status: AC
  Administered 2015-10-01: 20 mg via INTRAVENOUS
  Filled 2015-10-01: qty 2

## 2015-10-01 MED ORDER — METOPROLOL TARTRATE 25 MG PO TABS
50.0000 mg | ORAL_TABLET | Freq: Once | ORAL | Status: AC
Start: 2015-10-01 — End: 2015-10-01
  Administered 2015-10-01: 50 mg via ORAL
  Filled 2015-10-01: qty 2

## 2015-10-01 NOTE — Discharge Instructions (Signed)
Your tests today suggest that you may have an exacerbation of congestive heart failure. Your heart enzyme is stable and unchanged from your first test. We do not think you are having a heart attack. Take Lasix as prescribed until you see your cardiologist. Be sure to hydrate throughout the day as well to prevent dehydration which could stress your kidney function. Use your Symbicort and albuterol as prescribed. Follow up with your cardiologist and primary care doctor. Return if symptoms worsen.  Cough, Adult Coughing is a reflex that clears your throat and your airways. Coughing helps to heal and protect your lungs. It is normal to cough occasionally, but a cough that happens with other symptoms or lasts a long time may be a sign of a condition that needs treatment. A cough may last only 2-3 weeks (acute), or it may last longer than 8 weeks (chronic). CAUSES Coughing is commonly caused by:  Breathing in substances that irritate your lungs.  A viral or bacterial respiratory infection.  Allergies.  Asthma.  Postnasal drip.  Smoking.  Acid backing up from the stomach into the esophagus (gastroesophageal reflux).  Certain medicines.  Chronic lung problems, including COPD (or rarely, lung cancer).  Other medical conditions such as heart failure. HOME CARE INSTRUCTIONS  Pay attention to any changes in your symptoms. Take these actions to help with your discomfort:  Take medicines only as told by your health care provider.  If you were prescribed an antibiotic medicine, take it as told by your health care provider. Do not stop taking the antibiotic even if you start to feel better.  Talk with your health care provider before you take a cough suppressant medicine.  Drink enough fluid to keep your urine clear or pale yellow.  If the air is dry, use a cold steam vaporizer or humidifier in your bedroom or your home to help loosen secretions.  Avoid anything that causes you to cough at  work or at home.  If your cough is worse at night, try sleeping in a semi-upright position.  Avoid cigarette smoke. If you smoke, quit smoking. If you need help quitting, ask your health care provider.  Avoid caffeine.  Avoid alcohol.  Rest as needed. SEEK MEDICAL CARE IF:   You have new symptoms.  You cough up pus.  Your cough does not get better after 2-3 weeks, or your cough gets worse.  You cannot control your cough with suppressant medicines and you are losing sleep.  You develop pain that is getting worse or pain that is not controlled with pain medicines.  You have a fever.  You have unexplained weight loss.  You have night sweats. SEEK IMMEDIATE MEDICAL CARE IF:  You cough up blood.  You have difficulty breathing.  Your heartbeat is very fast.   This information is not intended to replace advice given to you by your health care provider. Make sure you discuss any questions you have with your health care provider.   Document Released: 09/02/2010 Document Revised: 11/25/2014 Document Reviewed: 05/13/2014 Elsevier Interactive Patient Education [employer]

## 2015-10-01 NOTE — ED Notes (Signed)
Pt BP cuff was repositioned at this time

## 2016-09-04 ENCOUNTER — Encounter: Payer: Self-pay | Admitting: Emergency Medicine

## 2016-09-04 ENCOUNTER — Emergency Department: Payer: Medicare HMO

## 2016-09-04 DIAGNOSIS — R079 Chest pain, unspecified: Secondary | ICD-10-CM | POA: Diagnosis not present

## 2016-09-04 DIAGNOSIS — F17210 Nicotine dependence, cigarettes, uncomplicated: Secondary | ICD-10-CM | POA: Insufficient documentation

## 2016-09-04 DIAGNOSIS — Z951 Presence of aortocoronary bypass graft: Secondary | ICD-10-CM | POA: Diagnosis not present

## 2016-09-04 DIAGNOSIS — I509 Heart failure, unspecified: Secondary | ICD-10-CM | POA: Insufficient documentation

## 2016-09-04 DIAGNOSIS — M7989 Other specified soft tissue disorders: Secondary | ICD-10-CM | POA: Insufficient documentation

## 2016-09-04 DIAGNOSIS — R51 Headache: Secondary | ICD-10-CM | POA: Insufficient documentation

## 2016-09-04 DIAGNOSIS — I110 Hypertensive heart disease with heart failure: Secondary | ICD-10-CM | POA: Insufficient documentation

## 2016-09-04 DIAGNOSIS — Z79899 Other long term (current) drug therapy: Secondary | ICD-10-CM | POA: Diagnosis not present

## 2016-09-04 DIAGNOSIS — Z96659 Presence of unspecified artificial knee joint: Secondary | ICD-10-CM | POA: Insufficient documentation

## 2016-09-04 LAB — CBC
HEMATOCRIT: 41.8 % (ref 40.0–52.0)
HEMOGLOBIN: 14.4 g/dL (ref 13.0–18.0)
MCH: 28.9 pg (ref 26.0–34.0)
MCHC: 34.5 g/dL (ref 32.0–36.0)
MCV: 84 fL (ref 80.0–100.0)
Platelets: 174 10*3/uL (ref 150–440)
RBC: 4.98 MIL/uL (ref 4.40–5.90)
RDW: 13.5 % (ref 11.5–14.5)
WBC: 6.3 10*3/uL (ref 3.8–10.6)

## 2016-09-04 NOTE — ED Triage Notes (Signed)
Pt to triage via w/c with no distress noted; pt reports HA to left temple that began yesterday; since noon c/o with left [facility]ided [facility]

## 2016-09-05 ENCOUNTER — Emergency Department: Payer: Medicare HMO

## 2016-09-05 ENCOUNTER — Emergency Department
Admission: EM | Admit: 2016-09-05 | Discharge: 2016-09-05 | Disposition: A | Discharge status: Home / Self Care | Payer: Medicare HMO | Attending: Emergency Medicine | Admitting: Emergency Medicine

## 2016-09-05 DIAGNOSIS — R51 Headache: Secondary | ICD-10-CM

## 2016-09-05 DIAGNOSIS — R519 Headache, unspecified: Secondary | ICD-10-CM

## 2016-09-05 DIAGNOSIS — R079 Chest pain, unspecified: Secondary | ICD-10-CM

## 2016-09-05 LAB — BASIC METABOLIC PANEL
ANION GAP: 6 (ref 5–15)
BUN: 24 mg/dL — AB (ref 6–20)
CHLORIDE: 111 mmol/L (ref 101–111)
CO2: 24 mmol/L (ref 22–32)
Calcium: 9.2 mg/dL (ref 8.9–10.3)
Creatinine, Ser: 1.17 mg/dL (ref 0.61–1.24)
GFR calc Af Amer: >60 mL/min (ref >60)
GFR calc non Af Amer: >60 mL/min (ref >60)
Glucose, Bld: 172 mg/dL — ABNORMAL HIGH (ref 65–99)
POTASSIUM: 3.5 mmol/L (ref 3.5–5.1)
SODIUM: 141 mmol/L (ref 135–145)

## 2016-09-05 LAB — BRAIN NATRIURETIC PEPTIDE: B Natriuretic Peptide: 132 pg/mL — ABNORMAL HIGH (ref 0.0–100.0)

## 2016-09-05 LAB — TROPONIN I
TROPONIN I: 0.03 ng/mL — AB (ref <0.03)
Troponin I: 0.03 ng/mL (ref <0.03)

## 2016-09-05 MED ORDER — DIPHENHYDRAMINE HCL 50 MG/ML IJ SOLN
25.0000 mg | Freq: Once | INTRAMUSCULAR | Status: AC
  Administered 2016-09-05: 25 mg via INTRAVENOUS
  Filled 2016-09-05: qty 1

## 2016-09-05 MED ORDER — METOCLOPRAMIDE HCL 5 MG/ML IJ SOLN
10.0000 mg | Freq: Once | INTRAMUSCULAR | Status: AC
  Administered 2016-09-05: 10 mg via INTRAVENOUS
  Filled 2016-09-05: qty 2

## 2016-09-05 MED ORDER — KETOROLAC TROMETHAMINE 30 MG/ML IJ SOLN
30.0000 mg | Freq: Once | INTRAMUSCULAR | Status: AC
  Administered 2016-09-05: 30 mg via INTRAVENOUS
  Filled 2016-09-05: qty 1

## 2016-09-05 MED ORDER — GI COCKTAIL ~~LOC~~
30.0000 mL | Freq: Once | ORAL | Status: AC
  Administered 2016-09-05: 30 mL via ORAL
  Filled 2016-09-05: qty 30

## 2016-09-05 NOTE — Discharge Instructions (Signed)
Please follow up with her primary care for further evaluation of her headache and chest pain. Your blood work is unremarkable at this time and urine workup is unremarkable. Please return with any worsening conditions or any other concerns.

## 2016-09-05 NOTE — ED Provider Notes (Signed)
[facility] Emergency Department Provider Note   ____________________________________________   First MD Initiated Contact with Patient 09/05/16 [phone]     (approximate)  I have reviewed the triage vital signs and the nursing notes.   HISTORY  Chief Complaint Headache and Chest Pain    HPI Albert Johnson is a 66 y.o. male who comes into the hospital today with multiple complaints. The patient reports that yesterday he had an awful headache. He states it was the left temple. He is took a lot of medicine but reports that after some time and developed chest pain. The patient never had any blurred vision and he reports that the headache did go away. The patient reports that the headache returned and then his wife stated that his left leg looked swollen. He also had some left shoulder pain. His wife was concerned given all the symptoms and she recommended that he come into the hospital to get checked out. He reports that some of the pain eased when he arrived but then the chest pain came back. He reports this headache is throbbing. He took 800 mg of ibuprofen, aspirin and then 3 Percocet. The patient reports that the pain moved from his head to the back of his neck. He had slept well yesterday so he is unsure as to why he has this headache. He states that the chest pain as squeezing in his mid chest and he does have some shortness of breath and sweats. He also had some left arm pain. The patient rates his chest pain a 7 out of 10 in intensity. He is here today for evaluation of these symptoms.   Past Medical History:  Diagnosis Date  . Arthritis   . CHF (congestive heart failure) (HCC)   . COPD (chronic obstructive pulmonary disease) (HCC)   . Hyperlipidemia   . Hypertension   . Renal disorder     There are no active problems to display for this patient.   Past Surgical History:  Procedure Laterality Date  . CORONARY ARTERY BYPASS GRAFT    . REPLACEMENT TOTAL  KNEE      Prior to Admission medications   Medication Sig Start Date End Date Taking? Authorizing Provider  albuterol (PROVENTIL HFA;VENTOLIN HFA) 108 (90 Base) MCG/ACT inhaler Inhale 2 puffs into the lungs every 6 (six) hours as needed for wheezing.     [provider]  atorvastatin (LIPITOR) 80 MG tablet Take 80 mg by mouth at bedtime. 05/21/15   [provider]  benzonatate (TESSALON) 100 MG capsule Take 1 capsule (100 mg total) by mouth 3 (three) times daily as needed for cough. 10/01/15   Antony Madura, PA-C  budesonide-formoterol (SYMBICORT) 80-4.5 MCG/ACT inhaler Inhale 2 puffs into the lungs 2 (two) times daily. 03/28/12   [provider]  Cholecalciferol (VITAMIN D-1000 MAX ST) 1000 units tablet Take 1,000 mg by mouth daily.    [provider]  DULoxetine (CYMBALTA) 60 MG capsule Take 60 mg by mouth daily. 09/22/15   [provider]  furosemide (LASIX) 20 MG tablet Take 1 tablet (20 mg total) by mouth daily. 10/01/15   Antony Madura, PA-C  isosorbide mononitrate (IMDUR) 30 MG 24 hr tablet Take 30 mg by mouth daily. 07/29/15 07/28/16  [provider]  lisinopril (PRINIVIL,ZESTRIL) 20 MG tablet Take 20 mg by mouth daily. 06/29/15   [provider]  losartan (COZAAR) 50 MG tablet Take 50 mg by mouth daily. 09/14/15   [provider]  metoprolol (  LOPRESSOR) 50 MG tablet Take 50 mg by mouth 2 (two) times daily.    [provider]  nitroGLYCERIN (NITROSTAT) 0.4 MG SL tablet Place 0.4 mg under the tongue every 5 (five) minutes as needed. 07/29/15 07/28/16  [provider]  omeprazole (PRILOSEC) 20 MG capsule Take 20 mg by mouth daily. 06/29/15   [provider]  oxyCODONE-acetaminophen (PERCOCET) 7.5-325 MG tablet Take 1 tablet by mouth every 8 (eight) hours as needed for moderate pain.  09/25/15   [provider]  potassium chloride 20 MEQ/15ML (10%) SOLN Take 10 mEq by mouth daily.    [provider]    Allergies Patient has no known allergies.  No family history on file.  Social History Social History  Substance Use Topics  . Smoking status: Current Every Day Smoker    Packs/day: 0.50    Types: Cigarettes  . Smokeless tobacco: Never Used  . Alcohol use No    Review of Systems  Constitutional: Sweats Eyes: No visual changes. ENT: No sore throat. Cardiovascular: chest pain. Respiratory:  shortness of breath. Gastrointestinal: No abdominal pain.  No nausea, no vomiting.  No diarrhea.  No constipation. Genitourinary: Negative for dysuria. Musculoskeletal: Left arm and shoulder pain Skin: Negative for rash. Neurological: Headache   ____________________________________________   PHYSICAL EXAM:  VITAL SIGNS: ED Triage Vitals [09/04/16 2325]  Enc Vitals Group     BP (!) 170/79     Pulse Rate 90     Resp 20     Temp 98.9 F (37.2 C)     Temp Source Oral     SpO2 97 %     Weight 275 lb (124.7 kg)     Height 5\' 11"  (1.803 m)     Head Circumference      Peak Flow      Pain Score 8     Pain Loc      Pain Edu?      Excl. in GC?     Constitutional: Alert and oriented. Well appearing and in Mild distress. Eyes: Conjunctivae are normal. PERRL. EOMI. Head: Atraumatic. Nose: No congestion/rhinnorhea. Mouth/Throat: Mucous membranes are moist.  Oropharynx non-erythematous. Cardiovascular: Normal rate, regular rhythm. Grossly normal heart sounds.  Good peripheral circulation. Respiratory: Normal respiratory effort.  No retractions. Lungs CTAB. Gastrointestinal: Soft and nontender. No distention. Positive bowel sounds Musculoskeletal: Some mild edema to the left lower extremity  Neurologic:  Normal speech and language. Cranial nerves II through XII are grossly intact with no focal motor or neuro deficits Skin:  Skin is warm, dry and intact.  Psychiatric: Mood and affect are normal.   ____________________________________________   LABS (all labs ordered are  listed, but only abnormal results are displayed)  Labs Reviewed  BASIC METABOLIC PANEL - Abnormal; Notable for the following:       Result Value   Glucose, Bld 172 (*)    BUN 24 (*)    All other components within normal limits  TROPONIN I - Abnormal; Notable for the following:    Troponin I 0.03 (*)    All other components within normal limits  BRAIN NATRIURETIC PEPTIDE - Abnormal; Notable for the following:    B Natriuretic Peptide 132.0 (*)    All other components within normal limits  TROPONIN I - Abnormal; Notable for the following:    Troponin I 0.03 (*)    All other components within normal limits  CBC   ____________________________________________  EKG  ED ECG REPORT I, Trace Wirick,  Melchor Amour, the attending physician, personally viewed and interpreted this ECG.   Date: 09/04/2016  EKG Time: 2326  Rate: 81  Rhythm: normal sinus rhythm  Axis: normal  Intervals:none  ST&T Change: Flipped T waves in leads I, II, aVL, V5, V6. Some mild ST segment elevation in lead V1 but it is not 2 boxes. Some similarities to EKG from 10/02/2015.  ____________________________________________  RADIOLOGY  Dg Chest 2 View  Result Date: 09/05/2016 CLINICAL DATA:  Left-sided chest pain.  Shortness of breath. EXAM: CHEST  2 VIEW COMPARISON:  09/30/2015 FINDINGS: Improved lung volumes from prior exam. Stable mild cardiomegaly and mediastinal contours. Mild vascular congestion without overt pulmonary edema. No consolidation, pleural fluid or pneumothorax. No acute osseous abnormalities. IMPRESSION: Stable cardiomegaly.  Mild vascular congestion. Electronically Signed   By: Rubye Oaks M.D.   On: 09/05/2016 00:00   US Venous Img Lower Unilateral Left  Result Date: 09/05/2016 CLINICAL DATA:  Left leg swelling for 2 days. EXAM: LEFT LOWER EXTREMITY VENOUS DOPPLER ULTRASOUND TECHNIQUE: Gray-scale sonography with graded compression, as well as color Doppler and duplex ultrasound were performed to  evaluate the lower extremity deep venous systems from the level of the common femoral vein and including the common femoral, femoral, profunda femoral, popliteal and calf veins including the posterior tibial, peroneal and gastrocnemius veins when visible. The superficial great saphenous vein was also interrogated. Spectral Doppler was utilized to evaluate flow at rest and with distal augmentation maneuvers in the common femoral, femoral and popliteal veins. COMPARISON:  None. FINDINGS: Contralateral Common Femoral Vein: Respiratory phasicity is normal and symmetric with the symptomatic side. No evidence of thrombus. Normal compressibility. Common Femoral Vein: No evidence of thrombus. Normal compressibility, respiratory phasicity and response to augmentation. Saphenofemoral Junction: No evidence of thrombus. Normal compressibility and flow on color Doppler imaging. Profunda Femoral Vein: No evidence of thrombus. Normal compressibility and flow on color Doppler imaging. Femoral Vein: No evidence of thrombus. Normal compressibility, respiratory phasicity and response to augmentation. Popliteal Vein: No evidence of thrombus. Normal compressibility, respiratory phasicity and response to augmentation. Calf Veins: No evidence of thrombus. Normal compressibility and flow on color Doppler imaging. Superficial Great Saphenous Vein: No evidence of thrombus. Normal compressibility and flow on color Doppler imaging. Venous Reflux:  None. Other Findings:  None. IMPRESSION: No evidence of DVT within the left lower extremity. Electronically Signed   By: Rubye Oaks M.D.   On: 09/05/2016 03:32    ____________________________________________   PROCEDURES  Procedure(s) performed: None  Procedures  Critical Care performed: No  ____________________________________________   INITIAL IMPRESSION / ASSESSMENT AND PLAN / ED COURSE  Pertinent labs & imaging results that were available during my care of the patient  were reviewed by me and considered in my medical decision making (see chart for details).  This is a 66 year old male who comes into the hospital today with a headache as well as some chest pain. I did give the patient some Reglan, Benadryl, Toradol and a GI cocktail. Given the concern for possible pulmonary edema did not give him any fluids. After the medication the patient reports that he feels much improved. He was sleeping comfortably without any difficulty. I did repeat the patient's troponin and it was negative. I will discharge the patient to have him follow-up with his primary care physician for further evaluation of the symptoms. The patient understands and agrees the plan as stated. He has no further questions or concerns.      ____________________________________________   FINAL  CLINICAL IMPRESSION(S) / ED DIAGNOSES  Final diagnoses:  Chest pain, unspecified type  Acute nonintractable headache, unspecified headache type      NEW MEDICATIONS STARTED DURING THIS VISIT:  New Prescriptions   No medications on file     Note:  This document was prepared using Dragon voice recognition software and may include unintentional dictation errors.    Rebecka Apley, MD 09/05/16 [phone]

## 2016-11-16 ENCOUNTER — Emergency Department: Payer: Medicare HMO

## 2016-11-16 ENCOUNTER — Encounter: Payer: Self-pay | Admitting: Emergency Medicine

## 2016-11-16 ENCOUNTER — Emergency Department
Admission: EM | Admit: 2016-11-16 | Discharge: 2016-11-17 | Disposition: A | Discharge status: Home / Self Care | Payer: Medicare HMO | Attending: Emergency Medicine | Admitting: Emergency Medicine

## 2016-11-16 DIAGNOSIS — M79672 Pain in left foot: Secondary | ICD-10-CM | POA: Diagnosis present

## 2016-11-16 DIAGNOSIS — M25521 Pain in right elbow: Secondary | ICD-10-CM | POA: Insufficient documentation

## 2016-11-16 DIAGNOSIS — M25511 Pain in right shoulder: Secondary | ICD-10-CM | POA: Diagnosis not present

## 2016-11-16 DIAGNOSIS — I110 Hypertensive heart disease with heart failure: Secondary | ICD-10-CM | POA: Diagnosis not present

## 2016-11-16 DIAGNOSIS — J449 Chronic obstructive pulmonary disease, unspecified: Secondary | ICD-10-CM | POA: Diagnosis not present

## 2016-11-16 DIAGNOSIS — Y92008 Other place in unspecified non-institutional (private) residence as the place of occurrence of the external cause: Secondary | ICD-10-CM | POA: Insufficient documentation

## 2016-11-16 DIAGNOSIS — I509 Heart failure, unspecified: Secondary | ICD-10-CM | POA: Insufficient documentation

## 2016-11-16 DIAGNOSIS — Y999 Unspecified external cause status: Secondary | ICD-10-CM | POA: Insufficient documentation

## 2016-11-16 DIAGNOSIS — S060X1A Concussion with loss of consciousness of 30 minutes or less, initial encounter: Secondary | ICD-10-CM | POA: Diagnosis not present

## 2016-11-16 DIAGNOSIS — Y93H9 Activity, other involving exterior property and land maintenance, building and construction: Secondary | ICD-10-CM | POA: Insufficient documentation

## 2016-11-16 DIAGNOSIS — M79604 Pain in right leg: Secondary | ICD-10-CM | POA: Insufficient documentation

## 2016-11-16 DIAGNOSIS — W19XXXA Unspecified fall, initial encounter: Secondary | ICD-10-CM

## 2016-11-16 DIAGNOSIS — F17210 Nicotine dependence, cigarettes, uncomplicated: Secondary | ICD-10-CM | POA: Diagnosis not present

## 2016-11-16 DIAGNOSIS — M545 Low back pain: Secondary | ICD-10-CM | POA: Diagnosis not present

## 2016-11-16 DIAGNOSIS — W132XXA Fall from, out of or through roof, initial encounter: Secondary | ICD-10-CM | POA: Diagnosis not present

## 2016-11-16 DIAGNOSIS — S42401A Unspecified fracture of lower end of right humerus, initial encounter for closed fracture: Secondary | ICD-10-CM

## 2016-11-16 MED ORDER — FENTANYL CITRATE (PF) 100 MCG/2ML IJ SOLN
100.0000 ug | Freq: Once | INTRAMUSCULAR | Status: AC
  Administered 2016-11-16: 100 ug via INTRAMUSCULAR

## 2016-11-16 MED ORDER — FENTANYL CITRATE (PF) 100 MCG/2ML IJ SOLN
100.0000 ug | Freq: Once | INTRAMUSCULAR | Status: DC
  Filled 2016-11-16: qty 2

## 2016-11-16 MED ORDER — OXYCODONE-ACETAMINOPHEN 5-325 MG PO TABS
2.0000 | ORAL_TABLET | Freq: Once | ORAL | Status: AC
  Administered 2016-11-16: 2 via ORAL
  Filled 2016-11-16: qty 2

## 2016-11-16 NOTE — ED Notes (Signed)
attemped IV start to left a/c and left hand without success; pt tolerated well; charge nurse called for 3rd attempt

## 2016-11-16 NOTE — ED Notes (Signed)
Pt to CT via stretcher accomp by CT tech 

## 2016-11-16 NOTE — ED Notes (Signed)
Dr. Norman at bedside.  

## 2016-11-16 NOTE — ED Notes (Signed)
Pt uprite on stretcher in exam room, c-collar in place, no distress noted; pt reports being up on roof approx 6pm this evening, slipped and fell off landing his right shoulder on a pile of shingles; st +LOC and may have laid there an hour before getting up and driving home; pt c/o intermittent left sided HA, lower back, left foot, right shoulder, right elbow pain; resp even/unlab, lungs clear, apical audible & regular, +BS, abd soft/nondist/nontender, +PP, -edema; wife at bedside

## 2016-11-16 NOTE — ED Notes (Signed)
Charge nurse unable to locate IV access; MD notified and med changed from IV to IM

## 2016-11-16 NOTE — ED Triage Notes (Signed)
Pt comes into the ED via POV c/o a fall from a rook.  Patient had wife drive him in.  Patient fell on right arm and hit his head.  C/o back pain, neck pain, left shoulder pain, right arm pain, left leg pain.  Patient alert and oriented x4.

## 2016-11-16 NOTE — ED Provider Notes (Signed)
[facility] Emergency Department Provider Note  ____________________________________________  Time seen: Approximately 11:03 PM  I have reviewed the triage vital signs and the nursing notes.   HISTORY  Chief Complaint Fall    HPI Albert Johnson is a 66 y.o. male who fell from a roof with multiple areas of pain. Patient reports that he was extremely weak and slipped off the roof onto a prior elevation most. He had a brief loss of consciousness but then was able to crawl to his truck and lift himself up by grabbing the handle. He was able to ambulate. He drove himself home and had his wife bring him into the emergency department. At this time, his most significant pain is in the left foot, but he also has right thigh pain, low back pain, upper neck pain, and right shoulder and elbow pain. He has not tried anything for his pain. No headache, visual changes, nausea or vomiting. No numbness tingling or weakness.   Past Medical History:  Diagnosis Date  . Arthritis   . CHF (congestive heart failure) (HCC)   . COPD (chronic obstructive pulmonary disease) (HCC)   . Hyperlipidemia   . Hypertension   . Renal disorder     There are no active problems to display for this patient.   Past Surgical History:  Procedure Laterality Date  . CORONARY ARTERY BYPASS GRAFT    . REPLACEMENT TOTAL KNEE      Current Outpatient Rx  . Order #: [id number] Class: Historical Med  . Order #: [id number] Class: Historical Med  . Order #: [id number] Class: Print  . Order #: [id number] Class: Historical Med  . Order #: [id number] Class: Historical Med  . Order #: [id number] Class: Historical Med  . Order #: [id number] Class: Print  . Order #: [id number] Class: Historical Med  . Order #: [id number] Class: Historical Med  . Order #: [id number] Class: Historical Med  . Order #: [id number] Class: Historical Med  . Order #: [id number] Class: Historical Med  . Order #: [id number] Class: Historical Med  .  Order #: [id number] Class: Historical Med  . Order #: [id number] Class: Historical Med    Allergies Patient has no known allergies.  No family history on file.  Social History Social History  Substance Use Topics  . Smoking status: Current Every Day Smoker    Packs/day: 0.50    Types: Cigarettes  . Smokeless tobacco: Never Used  . Alcohol use No    Review of Systems Constitutional: No fever/chills. Positive fall from a roof. Positive loss of consciousness. Eyes: No visual changes. No blurred or double vision. ENT: No sore throat. No congestion or rhinorrhea. Cardiovascular: Denies chest pain. Denies palpitations. Respiratory: Denies shortness of breath.  No cough. Gastrointestinal: No abdominal pain.  No nausea, no vomiting.  No diarrhea.  No constipation. Genitourinary: Negative for dysuria. Musculoskeletal: Positive for upper neck pain and lower back pain. Positive for left foot pain. Positive for right thigh pain. Positive for right shoulder and elbow pain. Skin: Negative for rash. Neurological: Negative for headaches. No focal numbness, tingling or weakness.     ____________________________________________   PHYSICAL EXAM:  VITAL SIGNS: ED Triage Vitals  Enc Vitals Group     BP 11/16/16 2055 (!) 180/99     Pulse Rate 11/16/16 2055 86     Resp 11/16/16 2055 (!) 22     Temp 11/16/16 2055 98.7 F (37.1 C)     Temp Source 11/16/16 2055 Oral     SpO2 11/16/16 2055 98 %     Weight 11/16/16 2056  285 lb (129.3 kg)     Height 11/16/16 2056 5\' 11"  (1.803 m)     Head Circumference --      Peak Flow --      Pain Score 11/16/16 2055 9     Pain Loc --      Pain Edu? --      Excl. in GC? --     Constitutional: Alert and oriented. Mildly uncomfortable appearing but in no acute distress. Answers questions appropriately. GCS is 15. Eyes: Conjunctivae are normal.  EOMI. No scleral icterus. No raccoon eyes. No facial instability. Head: Atraumatic. No Battle sign. Nose: No  congestion/rhinnorhea. No swelling over the nose or septal hematoma. Mouth/Throat: Mucous membranes are moist. No dental injury or malocclusion. Neck: No stridor.   The patient has mild tenderness in the upper C-spine without obvious deformities or step-offs. He is in a [place] collar which was placed in triage. Cardiovascular: Normal rate, regular rhythm. No murmurs, rubs or gallops. No bruising or evidence of injury over the chest, back, or abdomen. No palpable rib instability. No claviclar pain. Respiratory: Normal respiratory effort.  No accessory muscle use or retractions. Lungs CTAB.  No wheezes, rales or ronchi. Gastrointestinal: Soft, nontender and nondistended.  No guarding or rebound.  No peritoneal signs. Musculoskeletal: Pelvis is stable. The patient has tenderness to palpation in lower lumbar spine without obvious step-offs or deformities. He has no tenderness to palpation in the thoracic or upper lumbar spine, step-offs or deformities. He has tenderness to palpation in the proximal midfoot on the left with some mild swelling. He has pain in the foot with range of motion of the left ankle. He has normal DP and PT pulses bilaterally. He has tenderness in the proximal thigh as well as pain with range of motion of the right hip. He has full range of motion of the left shoulder, left elbow, bilateral wrists, and bilateral knees without pain. He has tenderness with range of motion of the right shoulder and elbow.  Neurologic:  A&Ox3.  Speech is clear.  Face and smile are symmetric.  EOMI.  Moves all extremities well. Skin:  Skin is warm, dry and intact. No rash noted. Psychiatric: Mood and affect are normal. Speech and behavior are normal.  Normal judgement.  ____________________________________________   LABS (all labs ordered are listed, but only abnormal results are displayed)  Labs Reviewed - No data to display ____________________________________________  EKG  Not  indicated ____________________________________________  RADIOLOGY  Ct Head Wo Contrast  Result Date: 11/16/2016 CLINICAL DATA:  Fall from roof with neck pain EXAM: CT HEAD WITHOUT CONTRAST CT CERVICAL SPINE WITHOUT CONTRAST TECHNIQUE: Multidetector CT imaging of the head and cervical spine was performed following the standard protocol without intravenous contrast. Multiplanar CT image reconstructions of the cervical spine were also generated. COMPARISON:  01/08/2014 FINDINGS: CT HEAD FINDINGS Brain: No acute territorial infarction, hemorrhage, or intracranial mass is seen. The ventricles are nonenlarged. Vascular: No hyperdense vessels. Scattered calcifications at the carotid siphons. Skull: Normal. Negative for fracture or focal lesion. Sinuses/Orbits: No acute finding. Other: None CT CERVICAL SPINE FINDINGS Alignment: Straightening of the cervical spine. No subluxation. Facet alignment within normal limits. Skull base and vertebrae: No acute fracture. No primary bone lesion or focal pathologic process. Soft tissues and spinal canal: No prevertebral fluid or swelling. No visible canal hematoma. Disc levels: Mild degenerative changes at C4-C5, C5-C6 and C6-C7. 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 Upper chest: Lung apices  clear. Enlarged appearing thyroid without dominant nodule Other: None IMPRESSION: 1. No CT evidence for acute intracranial abnormality. 2. Straightening of the cervical spine. No acute fracture or malalignment 3. 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 Enlarged appearing thyroid gland. Clinical correlation recommended. Electronically Signed   By: Jasmine PangKim  Fujinaga M.D.   On: 11/16/2016 22:32   Ct Cervical Spine Wo Contrast  Result Date: 11/16/2016 CLINICAL DATA:  Fall from roof with neck pain EXAM: CT HEAD WITHOUT CONTRAST CT CERVICAL SPINE WITHOUT CONTRAST TECHNIQUE: Multidetector CT imaging of the head and  cervical spine was performed following the standard protocol without intravenous contrast. Multiplanar CT image reconstructions of the cervical spine were also generated. COMPARISON:  01/08/2014 FINDINGS: CT HEAD FINDINGS Brain: No acute territorial infarction, hemorrhage, or intracranial mass is seen. The ventricles are nonenlarged. Vascular: No hyperdense vessels. Scattered calcifications at the carotid siphons. Skull: Normal. Negative for fracture or focal lesion. Sinuses/Orbits: No acute finding. Other: None CT CERVICAL SPINE FINDINGS Alignment: Straightening of the cervical spine. No subluxation. Facet alignment within normal limits. Skull base and vertebrae: No acute fracture. No primary bone lesion or focal pathologic process. Soft tissues and spinal canal: No prevertebral fluid or swelling. No visible canal hematoma. Disc levels: Mild degenerative changes at C4-C5, C5-C6 and C6-C7. 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 Upper chest: Lung apices clear. Enlarged appearing thyroid without dominant nodule Other: None IMPRESSION: 1. No CT evidence for acute intracranial abnormality. 2. Straightening of the cervical spine. No acute fracture or malalignment 3. 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 Enlarged appearing thyroid gland. Clinical correlation recommended. Electronically Signed   By: Jasmine PangKim  Fujinaga M.D.   On: 11/16/2016 22:32   Ct Lumbar Spine Wo Contrast  Result Date: 11/16/2016 CLINICAL DATA:  Larey SeatFell from rock.  Back pain. EXAM: CT LUMBAR SPINE WITHOUT CONTRAST TECHNIQUE: Multidetector CT imaging of the lumbar spine was performed without intravenous contrast administration. Multiplanar CT image reconstructions were also generated. COMPARISON:  CT abdomen and pelvis September 12, 2013 FINDINGS: SEGMENTATION: For the purposes of this report the last well-formed intervertebral disc space is reported as L5-S1. ALIGNMENT: Maintained  lumbar lordosis. No malalignment. VERTEBRAE: Vertebral bodies and posterior elements are intact. Intervertebral disc heights preserved. No destructive bony lesions. 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 PARASPINAL AND OTHER SOFT TISSUES: Included prevertebral and paraspinal soft tissues are nonacute. Mild calcific atherosclerosis aortoiliac vessels. DISC LEVELS: L1-2: No disc bulge, canal stenosis nor neural foraminal narrowing. Mild facet arthropathy. L2-3: No disc bulge, canal stenosis nor neural foraminal narrowing. Moderate RIGHT, moderate to severe LEFT facet arthropathy. L3-4: No disc bulge, canal stenosis nor neural foraminal narrowing. Moderate to severe facet arthropathy. L4-5: Small broad-based disc bulge. Severe facet arthropathy. Mild canal stenosis and mild neural foraminal narrowing. L5-S1: Small broad-based disc osteophyte complex. Severe RIGHT and moderate LEFT facet arthropathy. No canal stenosis. Severe RIGHT, moderate to severe LEFT neural foraminal narrowing. IMPRESSION: 1. No acute fracture or malalignment. 2. Degenerative change of the lumbar spine predominately on the basis of facet arthropathy. 3. Mild canal stenosis L4-5. Neural foraminal narrowing L4-5 and L5-S1: Severe on the RIGHT at L5-S1. Aortic Atherosclerosis (ICD10-I70.0). Electronically Signed   By: Awilda Metroourtnay  Bloomer M.D.   On: 11/16/2016 22:25    ____________________________________________   PROCEDURES  Procedure(s) performed: None  Procedures  Critical Care performed: No ____________________________________________   INITIAL IMPRESSION / ASSESSMENT AND PLAN / ED COURSE  Pertinent labs & imaging results that were available during my care of the patient were reviewed by me and considered in my medical decision making (see chart for details).  66 y.o. male is above her roof was multiple areas of pain. We will get imaging to evaluate for injury of the head, neck, lower lumbar spine, and extremities in  the areas where he has focal tenderness. The patient will be treated symptomatically for his pain.  ----------------------------------------- 11:08 PM on 11/16/2016 -----------------------------------------  At this time, the patient has reassuring CTs of the head, C-spine and lumbar spine. His imaging results for the plain x-rays are signed out to Dr. Manson Passey, who will follow up the studies and make a final disposition.  ____________________________________________  FINAL CLINICAL IMPRESSION(S) / ED DIAGNOSES  Final diagnoses:  Fall from roof, initial encounter         NEW MEDICATIONS STARTED DURING THIS VISIT:  New Prescriptions   No medications on file      Rockne Menghini, MD 11/16/16 2309

## 2016-11-16 NOTE — ED Notes (Signed)
Dr Manson PasseyBrown at bedside; c-collar removed

## 2016-11-17 MED ORDER — LIDOCAINE 5 % EX PTCH
1.0000 | MEDICATED_PATCH | Freq: Once | CUTANEOUS | Status: DC
  Administered 2016-11-17: 1 via TRANSDERMAL
  Filled 2016-11-17: qty 1

## 2016-11-17 MED ORDER — OXYCODONE-ACETAMINOPHEN 5-325 MG PO TABS: 1.0000 | ORAL_TABLET | ORAL | 0 refills | Status: DC | PRN

## 2016-11-17 NOTE — ED Provider Notes (Signed)
I assumed care of the patient from Dr. Sharma Covert 11:00 PM with examination to follow-up on x-rays that were performed. X-rays revealed: Final result by Adrian Prows, MD (11/16/16 23:02:31)           Narrative:   CLINICAL DATA: Fall off of roof  EXAM: CHEST 1 VIEW  COMPARISON: 09/04/2016  FINDINGS: Borderline cardiomegaly. No pleural effusion or pneumothorax. Normal mediastinal contour. No acute infiltrate.  IMPRESSION: Borderline cardiomegaly. Negative for pleural effusion or pneumothorax   Electronically Signed By: Jasmine Pang M.D. On: 11/16/2016 23:02            DG Shoulder Right (Final result)  Result time 11/16/16 23:03:01  Final result by Adrian Prows, MD (11/16/16 23:03:01)           Narrative:   CLINICAL DATA: Fall off of roof with pain  EXAM: RIGHT SHOULDER - 2+ VIEW  COMPARISON: None.  FINDINGS: No fracture or dislocation. Mild glenohumeral degenerative change  IMPRESSION: No acute osseous abnormality   Electronically Signed By: Jasmine Pang M.D. On: 11/16/2016 23:03            DG Elbow Complete Right (Final result)  Result time 11/16/16 23:04:53  Final result by Adrian Prows, MD (11/16/16 23:04:53)           Narrative:   CLINICAL DATA: Fell off of roof  EXAM: RIGHT ELBOW - COMPLETE 3+ VIEW  COMPARISON: None.  FINDINGS: Limited due to absence of true lateral view. Probable elbow effusion. Possible tiny cortex deformity at the radial head  IMPRESSION: Suspect that there is an elbow effusion but limited due to positioning. Possible cortical fracture at the radial head.   Electronically Signed By: Jasmine Pang M.D. On: 11/16/2016 23:04            DG Foot Complete Left (Final result)  Result time 11/16/16 23:07:32  Final result by Adrian Prows, MD (11/16/16 23:07:32)           Narrative:   CLINICAL DATA: Fall from roof  EXAM: LEFT FOOT - COMPLETE 3+ VIEW  COMPARISON:  None.  FINDINGS: No acute displaced fracture or malalignment. Dorsal degenerative changes and probable os adjacent to the navicular. Mild degenerative changes at the first MTP joint  IMPRESSION: No definite acute osseous abnormality   Electronically Signed By: Jasmine Pang M.D. On: 11/16/2016 23:07            DG Ankle Complete Left (Final result)  Result time 11/16/16 23:06:03  Final result by Adrian Prows, MD (11/16/16 23:06:03)           Narrative:   CLINICAL DATA: Fall from roof  EXAM: LEFT ANKLE COMPLETE - 3+ VIEW  COMPARISON: None.  FINDINGS: No acute displaced fracture or malalignment. Ankle mortise is symmetric. Probable os adjacent to the dorsal navicular. Dorsal degenerative changes of the tarsal bones.  IMPRESSION: No acute osseous abnormality.   Electronically Signed By: Jasmine Pang M.D. On: 11/16/2016 23:06            DG HIP UNILAT WITH PELVIS 2-3 VIEWS LEFT (Final result)  Result time 11/16/16 23:08:40  Procedure changed from DG Hip Unilat W or Wo Pelvis 1 View Left  Final result by Adrian Prows, MD (11/16/16 23:08:40)           Narrative:   CLINICAL DATA: Fall from roof  EXAM: DG HIP (WITH OR WITHOUT PELVIS) 2-3V LEFT  COMPARISON: None.  FINDINGS: SI joints are symmetric. The pubic symphysis is intact.  The rami appear intact. Moderate degenerative changes. No acute displaced fracture or dislocation.  IMPRESSION: No acute osseous abnormality   Electronically Signed By: Jasmine PangKim Fujinaga M.D. On: 11/16/2016 23:08            DG HIP UNILAT WITH PELVIS 2-3 VIEWS RIGHT (Final result)  Result time 11/16/16 23:09:19  Procedure changed from DG Hip Unilat W or Wo Pelvis 1 View Right  Final result by Adrian ProwsFujinaga, Kim M, MD (11/16/16 23:09:19)           Narrative:   CLINICAL DATA: Fall from roof  EXAM: DG HIP (WITH OR WITHOUT PELVIS) 2-3V RIGHT  COMPARISON: None.  FINDINGS: Moderate  arthritis of the right hip. The right rami appear intact. No acute displaced fracture or dislocation.  IMPRESSION: Moderate arthritis. No acute osseous abnormality.   Electronically Signed By: Jasmine PangKim Fujinaga M.D. On: 11/16/2016 23:09            CT Head Wo Contrast (Final result)  Result time 11/16/16 22:32:45  Final result by Adrian ProwsFujinaga, Kim M, MD (11/16/16 22:32:45)           Narrative:   CLINICAL DATA: Fall from roof with neck pain  EXAM: CT HEAD WITHOUT CONTRAST  CT CERVICAL SPINE WITHOUT CONTRAST  TECHNIQUE: Multidetector CT imaging of the head and cervical spine was performed following the standard protocol without intravenous contrast. Multiplanar CT image reconstructions of the cervical spine were also generated.  COMPARISON: 01/08/2014  FINDINGS: CT HEAD FINDINGS  Brain: No acute territorial infarction, hemorrhage, or intracranial mass is seen. The ventricles are nonenlarged.  Vascular: No hyperdense vessels. Scattered calcifications at the carotid siphons.  Skull: Normal. Negative for fracture or focal lesion.  Sinuses/Orbits: No acute finding.  Other: None  CT CERVICAL SPINE FINDINGS  Alignment: Straightening of the cervical spine. No subluxation. Facet alignment within normal limits.  Skull base and vertebrae: No acute fracture. No primary bone lesion or focal pathologic process.  Soft tissues and spinal canal: No prevertebral fluid or swelling. No visible canal hematoma.  Disc levels: Mild degenerative changes at C4-C5, C5-C6 and C6-C7. 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  Upper chest: Lung apices clear. Enlarged appearing thyroid without dominant nodule  Other: None  IMPRESSION: 1. No CT evidence for acute intracranial abnormality. 2. Straightening of the cervical spine. No acute fracture or malalignment 3. Ossification of the posterior longitudinal ligament from C4 through  C7 4. Enlarged appearing thyroid gland. Clinical correlation recommended.   Electronically Signed By: Jasmine PangKim Fujinaga M.D. On: 11/16/2016 22:32            CT Cervical Spine Wo Contrast (Final result)  Result time 11/16/16 22:32:45  Final result by Adrian ProwsFujinaga, Kim M, MD (11/16/16 22:32:45)           Narrative:   CLINICAL DATA: Fall from roof with neck pain  EXAM: CT HEAD WITHOUT CONTRAST  CT CERVICAL SPINE WITHOUT CONTRAST  TECHNIQUE: Multidetector CT imaging of the head and cervical spine was performed following the standard protocol without intravenous contrast. Multiplanar CT image reconstructions of the cervical spine were also generated.  COMPARISON: 01/08/2014  FINDINGS: CT HEAD FINDINGS  Brain: No acute territorial infarction, hemorrhage, or intracranial mass is seen. The ventricles are nonenlarged.  Vascular: No hyperdense vessels. Scattered calcifications at the carotid siphons.  Skull: Normal. Negative for fracture or focal lesion.  Sinuses/Orbits: No acute finding.  Other: None  CT CERVICAL SPINE FINDINGS  Alignment: Straightening of the cervical spine. No  subluxation. Facet alignment within normal limits.  Skull base and vertebrae: No acute fracture. No primary bone lesion or focal pathologic process.  Soft tissues and spinal canal: No prevertebral fluid or swelling. No visible canal hematoma.  Disc levels: Mild degenerative changes at C4-C5, C5-C6 and C6-C7. 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  Upper chest: Lung apices clear. Enlarged appearing thyroid without dominant nodule  Other: None  IMPRESSION: 1. No CT evidence for acute intracranial abnormality. 2. Straightening of the cervical spine. No acute fracture or malalignment 3. 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 Enlarged appearing thyroid gland. Clinical  correlation recommended.   Electronically Signed By: Jasmine Pang M.D. On: 11/16/2016 22:32            CT Lumbar Spine Wo Contrast (Final result)  Result time 11/16/16 22:25:01  Final result by Awilda Metro, MD (11/16/16 22:25:01)           Narrative:   CLINICAL DATA: Fell from rock. Back pain.  EXAM: CT LUMBAR SPINE WITHOUT CONTRAST  TECHNIQUE: Multidetector CT imaging of the lumbar spine was performed without intravenous contrast administration. Multiplanar CT image reconstructions were also generated.  COMPARISON: CT abdomen and pelvis September 12, 2013  FINDINGS: SEGMENTATION: For the purposes of this report the last well-formed intervertebral disc space is reported as L5-S1.  ALIGNMENT: Maintained lumbar lordosis. No malalignment.  VERTEBRAE: Vertebral bodies and posterior elements are intact. Intervertebral disc heights preserved. No destructive bony lesions. 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  PARASPINAL AND OTHER SOFT TISSUES: Included prevertebral and paraspinal soft tissues are nonacute. Mild calcific atherosclerosis aortoiliac vessels.  DISC LEVELS:  L1-2: No disc bulge, canal stenosis nor neural foraminal narrowing. Mild facet arthropathy.  L2-3: No disc bulge, canal stenosis nor neural foraminal narrowing. Moderate RIGHT, moderate to severe LEFT facet arthropathy.  L3-4: No disc bulge, canal stenosis nor neural foraminal narrowing. Moderate to severe facet arthropathy.  L4-5: Small broad-based disc bulge. Severe facet arthropathy. Mild canal stenosis and mild neural foraminal narrowing.  L5-S1: Small broad-based disc osteophyte complex. Severe RIGHT and moderate LEFT facet arthropathy. No canal stenosis. Severe RIGHT, moderate to severe LEFT neural foraminal narrowing.  IMPRESSION: 1. No acute fracture or malalignment. 2. Degenerative change of the lumbar spine predominately on the basis of facet  arthropathy. 3. Mild canal stenosis L4-5. Neural foraminal narrowing L4-5 and L5-S1: Severe on the RIGHT at L5-S1.  Aortic Atherosclerosis (ICD10-I70.0).   Electronically Signed By: Awilda Metro M.D. On: 11/16/2016 22:25          ECG Results   None  As such acute finding of right radial head fracture noted and a such patient placed in a sling and advised to follow-up with orthopedist surgeon the day. Patient given a Percocet in the emergency department and will be prescribed same for home.   Darci Current, MD 11/17/16 [phone]

## 2019-07-30 ENCOUNTER — Encounter: Payer: Self-pay | Admitting: Emergency Medicine

## 2019-07-30 ENCOUNTER — Inpatient Hospital Stay
Admission: EM | Admit: 2019-07-30 | Discharge: 2019-08-01 | DRG: 291 | Disposition: A | Discharge status: Home Health | Payer: Medicare Other | Attending: Internal Medicine | Admitting: Internal Medicine

## 2019-07-30 ENCOUNTER — Emergency Department: Payer: Medicare Other

## 2019-07-30 ENCOUNTER — Inpatient Hospital Stay: Payer: Medicare Other

## 2019-07-30 ENCOUNTER — Other Ambulatory Visit: Payer: Self-pay

## 2019-07-30 DIAGNOSIS — N179 Acute kidney failure, unspecified: Secondary | ICD-10-CM | POA: Diagnosis present

## 2019-07-30 DIAGNOSIS — K760 Fatty (change of) liver, not elsewhere classified: Secondary | ICD-10-CM | POA: Diagnosis present

## 2019-07-30 DIAGNOSIS — M7989 Other specified soft tissue disorders: Secondary | ICD-10-CM | POA: Diagnosis present

## 2019-07-30 DIAGNOSIS — Z96659 Presence of unspecified artificial knee joint: Secondary | ICD-10-CM | POA: Diagnosis present

## 2019-07-30 DIAGNOSIS — J449 Chronic obstructive pulmonary disease, unspecified: Secondary | ICD-10-CM | POA: Diagnosis present

## 2019-07-30 DIAGNOSIS — R001 Bradycardia, unspecified: Secondary | ICD-10-CM | POA: Diagnosis present

## 2019-07-30 DIAGNOSIS — M1990 Unspecified osteoarthritis, unspecified site: Secondary | ICD-10-CM | POA: Diagnosis present

## 2019-07-30 DIAGNOSIS — Z720 Tobacco use: Secondary | ICD-10-CM

## 2019-07-30 DIAGNOSIS — E876 Hypokalemia: Secondary | ICD-10-CM | POA: Diagnosis present

## 2019-07-30 DIAGNOSIS — R140 Abdominal distension (gaseous): Secondary | ICD-10-CM | POA: Diagnosis present

## 2019-07-30 DIAGNOSIS — K219 Gastro-esophageal reflux disease without esophagitis: Secondary | ICD-10-CM | POA: Diagnosis present

## 2019-07-30 DIAGNOSIS — Z7982 Long term (current) use of aspirin: Secondary | ICD-10-CM

## 2019-07-30 DIAGNOSIS — F17210 Nicotine dependence, cigarettes, uncomplicated: Secondary | ICD-10-CM | POA: Diagnosis present

## 2019-07-30 DIAGNOSIS — I509 Heart failure, unspecified: Secondary | ICD-10-CM | POA: Diagnosis not present

## 2019-07-30 DIAGNOSIS — F17200 Nicotine dependence, unspecified, uncomplicated: Secondary | ICD-10-CM | POA: Diagnosis present

## 2019-07-30 DIAGNOSIS — Z79899 Other long term (current) drug therapy: Secondary | ICD-10-CM | POA: Diagnosis not present

## 2019-07-30 DIAGNOSIS — I5031 Acute diastolic (congestive) heart failure: Secondary | ICD-10-CM | POA: Diagnosis present

## 2019-07-30 DIAGNOSIS — Z79891 Long term (current) use of opiate analgesic: Secondary | ICD-10-CM

## 2019-07-30 DIAGNOSIS — J9601 Acute respiratory failure with hypoxia: Secondary | ICD-10-CM | POA: Diagnosis not present

## 2019-07-30 DIAGNOSIS — J9621 Acute and chronic respiratory failure with hypoxia: Secondary | ICD-10-CM | POA: Diagnosis present

## 2019-07-30 DIAGNOSIS — I10 Essential (primary) hypertension: Secondary | ICD-10-CM

## 2019-07-30 DIAGNOSIS — F17209 Nicotine dependence, unspecified, with unspecified nicotine-induced disorders: Secondary | ICD-10-CM | POA: Diagnosis not present

## 2019-07-30 DIAGNOSIS — I110 Hypertensive heart disease with heart failure: Secondary | ICD-10-CM

## 2019-07-30 DIAGNOSIS — Z7951 Long term (current) use of inhaled steroids: Secondary | ICD-10-CM

## 2019-07-30 DIAGNOSIS — R531 Weakness: Secondary | ICD-10-CM | POA: Diagnosis present

## 2019-07-30 DIAGNOSIS — Z951 Presence of aortocoronary bypass graft: Secondary | ICD-10-CM | POA: Diagnosis not present

## 2019-07-30 DIAGNOSIS — E785 Hyperlipidemia, unspecified: Secondary | ICD-10-CM | POA: Diagnosis present

## 2019-07-30 DIAGNOSIS — Z6839 Body mass index (BMI) 39.0-39.9, adult: Secondary | ICD-10-CM

## 2019-07-30 DIAGNOSIS — R7401 Elevation of levels of liver transaminase levels: Secondary | ICD-10-CM | POA: Diagnosis present

## 2019-07-30 DIAGNOSIS — Z20822 Contact with and (suspected) exposure to covid-19: Secondary | ICD-10-CM | POA: Diagnosis present

## 2019-07-30 DIAGNOSIS — G4733 Obstructive sleep apnea (adult) (pediatric): Secondary | ICD-10-CM | POA: Diagnosis present

## 2019-07-30 DIAGNOSIS — J432 Centrilobular emphysema: Secondary | ICD-10-CM | POA: Diagnosis not present

## 2019-07-30 LAB — CBC WITH DIFFERENTIAL/PLATELET
Abs Immature Granulocytes: 0.05 10*3/uL (ref 0.00–0.07)
Basophils Absolute: 0 10*3/uL (ref 0.0–0.1)
Basophils Relative: 1 %
Eosinophils Absolute: 0.2 10*3/uL (ref 0.0–0.5)
Eosinophils Relative: 3 %
HCT: 46.9 % (ref 39.0–52.0)
Hemoglobin: 15.1 g/dL (ref 13.0–17.0)
Immature Granulocytes: 1 %
Lymphocytes Relative: 25 %
Lymphs Abs: 1.9 10*3/uL (ref 0.7–4.0)
MCH: 28.6 pg (ref 26.0–34.0)
MCHC: 32.2 g/dL (ref 30.0–36.0)
MCV: 88.8 fL (ref 80.0–100.0)
Monocytes Absolute: 0.4 10*3/uL (ref 0.1–1.0)
Monocytes Relative: 6 %
Neutro Abs: 4.8 10*3/uL (ref 1.7–7.7)
Neutrophils Relative %: 64 %
Platelets: 193 10*3/uL (ref 150–400)
RBC: 5.28 MIL/uL (ref 4.22–5.81)
RDW: 13.7 % (ref 11.5–15.5)
WBC: 7.3 10*3/uL (ref 4.0–10.5)
nRBC: 0 % (ref 0.0–0.2)

## 2019-07-30 LAB — COMPREHENSIVE METABOLIC PANEL
ALT: 57 U/L — ABNORMAL HIGH (ref 0–44)
AST: 67 U/L — ABNORMAL HIGH (ref 15–41)
Albumin: 4 g/dL (ref 3.5–5.0)
Alkaline Phosphatase: 54 U/L (ref 38–126)
Anion gap: 6 (ref 5–15)
BUN: 19 mg/dL (ref 8–23)
CO2: 25 mmol/L (ref 22–32)
Calcium: 9.3 mg/dL (ref 8.9–10.3)
Chloride: 111 mmol/L (ref 98–111)
Creatinine, Ser: 1.26 mg/dL — ABNORMAL HIGH (ref 0.61–1.24)
GFR calc Af Amer: >60 mL/min (ref >60)
GFR calc non Af Amer: 59 mL/min — ABNORMAL LOW (ref >60)
Glucose, Bld: 150 mg/dL — ABNORMAL HIGH (ref 70–99)
Potassium: 5.1 mmol/L (ref 3.5–5.1)
Sodium: 142 mmol/L (ref 135–145)
Total Bilirubin: 1.3 mg/dL — ABNORMAL HIGH (ref 0.3–1.2)
Total Protein: 7.4 g/dL (ref 6.5–8.1)

## 2019-07-30 LAB — SARS CORONAVIRUS 2 BY RT PCR (HOSPITAL ORDER, PERFORMED IN ~~LOC~~ HOSPITAL LAB): SARS Coronavirus 2: NEGATIVE

## 2019-07-30 LAB — TROPONIN I (HIGH SENSITIVITY)
Troponin I (High Sensitivity): 22 ng/L — ABNORMAL HIGH (ref <18)
Troponin I (High Sensitivity): 37 ng/L — ABNORMAL HIGH (ref <18)
Troponin I (High Sensitivity): 44 ng/L — ABNORMAL HIGH (ref <18)

## 2019-07-30 LAB — HIV ANTIBODY (ROUTINE TESTING W REFLEX): HIV Screen 4th Generation wRfx: NONREACTIVE

## 2019-07-30 LAB — BRAIN NATRIURETIC PEPTIDE: B Natriuretic Peptide: 614 pg/mL — ABNORMAL HIGH (ref 0.0–100.0)

## 2019-07-30 MED ORDER — SODIUM CHLORIDE 0.9% FLUSH: 3.0000 mL | INTRAVENOUS | Status: DC | PRN

## 2019-07-30 MED ORDER — SODIUM CHLORIDE 0.9% FLUSH
3.0000 mL | Freq: Two times a day (BID) | INTRAVENOUS | Status: DC
  Administered 2019-07-30 – 2019-08-01 (×4): 3 mL via INTRAVENOUS

## 2019-07-30 MED ORDER — BENZONATATE 100 MG PO CAPS: 100.0000 mg | ORAL_CAPSULE | Freq: Three times a day (TID) | ORAL | Status: DC | PRN

## 2019-07-30 MED ORDER — ALBUTEROL SULFATE HFA 108 (90 BASE) MCG/ACT IN AERS: 2.0000 | INHALATION_SPRAY | Freq: Four times a day (QID) | RESPIRATORY_TRACT | Status: DC | PRN

## 2019-07-30 MED ORDER — NITROGLYCERIN 2 % TD OINT
1.0000 [in_us] | TOPICAL_OINTMENT | Freq: Once | TRANSDERMAL | Status: AC
  Administered 2019-07-30: 1 [in_us] via TOPICAL

## 2019-07-30 MED ORDER — NITROGLYCERIN 0.4 MG SL SUBL
SUBLINGUAL_TABLET | SUBLINGUAL | Status: AC
  Administered 2019-07-30: 0.4 mg via SUBLINGUAL
  Filled 2019-07-30: qty 1

## 2019-07-30 MED ORDER — OXYCODONE-ACETAMINOPHEN 7.5-325 MG PO TABS
1.0000 | ORAL_TABLET | Freq: Three times a day (TID) | ORAL | Status: DC | PRN
  Administered 2019-07-30: 1 via ORAL
  Filled 2019-07-30: qty 1

## 2019-07-30 MED ORDER — ONDANSETRON HCL 4 MG/2ML IJ SOLN: 4.0000 mg | Freq: Four times a day (QID) | INTRAMUSCULAR | Status: DC | PRN

## 2019-07-30 MED ORDER — ALBUTEROL SULFATE (2.5 MG/3ML) 0.083% IN NEBU: 2.5000 mg | INHALATION_SOLUTION | Freq: Four times a day (QID) | RESPIRATORY_TRACT | Status: DC | PRN

## 2019-07-30 MED ORDER — ACETAMINOPHEN 325 MG PO TABS: 650.0000 mg | ORAL_TABLET | ORAL | Status: DC | PRN

## 2019-07-30 MED ORDER — ATORVASTATIN CALCIUM 80 MG PO TABS
80.0000 mg | ORAL_TABLET | Freq: Every day | ORAL | Status: DC
  Administered 2019-07-30 – 2019-07-31 (×2): 80 mg via ORAL
  Filled 2019-07-30 (×2): qty 1

## 2019-07-30 MED ORDER — SODIUM CHLORIDE 0.9% FLUSH
10.0000 mL | Freq: Two times a day (BID) | INTRAVENOUS | Status: DC
  Administered 2019-07-30 – 2019-08-01 (×5): 10 mL

## 2019-07-30 MED ORDER — FUROSEMIDE 10 MG/ML IJ SOLN
40.0000 mg | Freq: Once | INTRAMUSCULAR | Status: AC
  Administered 2019-07-30: 40 mg via INTRAVENOUS

## 2019-07-30 MED ORDER — PANTOPRAZOLE SODIUM 40 MG PO TBEC
40.0000 mg | DELAYED_RELEASE_TABLET | Freq: Every day | ORAL | Status: DC
  Administered 2019-07-30 – 2019-08-01 (×3): 40 mg via ORAL
  Filled 2019-07-30 (×4): qty 1

## 2019-07-30 MED ORDER — NITROGLYCERIN 0.4 MG SL SUBL: 0.4000 mg | SUBLINGUAL_TABLET | SUBLINGUAL | Status: DC | PRN

## 2019-07-30 MED ORDER — LOSARTAN POTASSIUM 50 MG PO TABS
50.0000 mg | ORAL_TABLET | Freq: Every day | ORAL | Status: DC
  Administered 2019-07-30 – 2019-07-31 (×2): 50 mg via ORAL
  Filled 2019-07-30 (×3): qty 1

## 2019-07-30 MED ORDER — NICOTINE 7 MG/24HR TD PT24
7.0000 mg | MEDICATED_PATCH | Freq: Every day | TRANSDERMAL | Status: DC
  Administered 2019-07-30 – 2019-08-01 (×3): 7 mg via TRANSDERMAL
  Filled 2019-07-30 (×3): qty 1

## 2019-07-30 MED ORDER — DULOXETINE HCL 30 MG PO CPEP
60.0000 mg | ORAL_CAPSULE | Freq: Every day | ORAL | Status: DC
  Administered 2019-07-30 – 2019-08-01 (×3): 60 mg via ORAL
  Filled 2019-07-30 (×2): qty 2
  Filled 2019-07-30 (×2): qty 1

## 2019-07-30 MED ORDER — SODIUM CHLORIDE 0.9% FLUSH: 10.0000 mL | INTRAVENOUS | Status: DC | PRN

## 2019-07-30 MED ORDER — VITAMIN D 25 MCG (1000 UNIT) PO TABS
1000.0000 [IU] | ORAL_TABLET | Freq: Every day | ORAL | Status: DC
  Administered 2019-07-30 – 2019-08-01 (×3): 1000 [IU] via ORAL
  Filled 2019-07-30 (×3): qty 1

## 2019-07-30 MED ORDER — FUROSEMIDE 10 MG/ML IJ SOLN
40.0000 mg | Freq: Two times a day (BID) | INTRAMUSCULAR | Status: DC
  Administered 2019-07-30 – 2019-07-31 (×2): 40 mg via INTRAVENOUS
  Filled 2019-07-30 (×2): qty 4

## 2019-07-30 MED ORDER — SODIUM CHLORIDE 0.9 % IV SOLN: 250.0000 mL | INTRAVENOUS | Status: DC | PRN

## 2019-07-30 MED ORDER — NITROGLYCERIN 0.4 MG SL SUBL
0.4000 mg | SUBLINGUAL_TABLET | SUBLINGUAL | Status: DC | PRN
  Administered 2019-07-30: 0.4 mg via SUBLINGUAL

## 2019-07-30 MED ORDER — ISOSORBIDE MONONITRATE ER 30 MG PO TB24
30.0000 mg | ORAL_TABLET | Freq: Every day | ORAL | Status: DC
  Administered 2019-07-30 – 2019-07-31 (×2): 30 mg via ORAL
  Filled 2019-07-30 (×2): qty 1

## 2019-07-30 MED ORDER — MOMETASONE FURO-FORMOTEROL FUM 100-5 MCG/ACT IN AERO
2.0000 | INHALATION_SPRAY | Freq: Two times a day (BID) | RESPIRATORY_TRACT | Status: DC
  Administered 2019-07-30 – 2019-08-01 (×4): 2 via RESPIRATORY_TRACT
  Filled 2019-07-30 (×2): qty 8.8

## 2019-07-30 MED ORDER — ENOXAPARIN SODIUM 40 MG/0.4ML ~~LOC~~ SOLN
40.0000 mg | SUBCUTANEOUS | Status: DC
  Administered 2019-07-30 – 2019-07-31 (×2): 40 mg via SUBCUTANEOUS
  Filled 2019-07-30 (×2): qty 0.4

## 2019-07-30 MED ORDER — METOPROLOL TARTRATE 50 MG PO TABS
50.0000 mg | ORAL_TABLET | Freq: Two times a day (BID) | ORAL | Status: DC
  Administered 2019-07-31 – 2019-08-01 (×2): 50 mg via ORAL
  Filled 2019-07-30 (×6): qty 1

## 2019-07-30 NOTE — ED Notes (Signed)
Pt was given a gingerale in a cup with ice.

## 2019-07-30 NOTE — Consult Note (Signed)
Cardiology Consultation:   Patient ID: Albert Johnson MRN: [id number]; DOB: 1950/09/20  Admit date: 07/30/2019 Date of Consult: 07/30/2019  Primary Care Provider: Rogelia Rohrer, MD Primary Cardiologist: [place] rounding Primary Electrophysiologist:  None    Patient Profile:   Albert Johnson is a 69 y.o. male with a hx of hypertension, hyperlipidemia, COPD, current smoker x50 years who is being seen today for the evaluation of shortness of breath at the request of Dr.Agbata  History of Present Illness:   Albert Johnson is a 68 year old gentleman with history of hypertension, hyperlipidemia, COPD, current smoker x50 years, OSA on CPAP nightly who presents due to worsening shortness of breath and edema.  Patient states feeling out of breath with minimal exertion over the past week.  Symptoms were associated with worsening swelling in his face, legs, abdominal distention.  He is not able to lay flat on his back due to being out of breath, he sleeps on his side.  Due to worsening symptoms, he presented to the ED.  He has not used his CPAP mask for the past 4 months due to lack of a part which he ordered but has not received it yet.   In the ED, chest x-ray showed cardiomegaly and some infiltrates.  His oxygen saturations were in the 70s on room air.  He was placed on BiPAP and started on IV Lasix with improvement in breathing.  He is currently off BiPAP.  EKG showed sinus rhythm possible left atrial enlargement.  Troponins were minimally abnormal at 37, 22.     Past Medical History:  Diagnosis Date  . Arthritis   . CHF (congestive heart failure) ([place])   . COPD (chronic obstructive pulmonary disease) ([place])   . Hyperlipidemia   . Hypertension   . Renal disorder     Past Surgical History:  Procedure Laterality Date  . CORONARY ARTERY BYPASS GRAFT    . REPLACEMENT TOTAL KNEE       Home Medications:  Prior to Admission medications   Medication Sig Start Date End Date Taking?  Authorizing Provider  aspirin 81 MG EC tablet Take by mouth. 05/27/18  Yes [provider]  atorvastatin (LIPITOR) 10 MG tablet Take 10 mg by mouth daily.   Yes [provider]  metoprolol succinate (TOPROL-XL) 100 MG 24 hr tablet Take 100 mg by mouth daily. 05/14/19  Yes [provider]  omeprazole (PRILOSEC) 20 MG capsule Take 20 mg by mouth daily.   Yes [provider]  sildenafil (VIAGRA) 100 MG tablet  07/19/19  Yes [provider]  albuterol (PROVENTIL HFA;VENTOLIN HFA) 108 (90 Base) MCG/ACT inhaler Inhale 2 puffs into the lungs every 6 (six) hours as needed for wheezing.     [provider]  budesonide-formoterol (SYMBICORT) 80-4.5 MCG/ACT inhaler Inhale 2 puffs into the lungs 2 (two) times daily. 03/28/12   [provider]  Cholecalciferol (VITAMIN D-1000 MAX ST) 1000 units tablet Take 1,000 mg by mouth daily.    [provider]  NARCAN 4 MG/0.1ML LIQD nasal spray kit SMARTSIG:1 Spray(s) Both Nares Once PRN 05/27/19   [provider]  nitroGLYCERIN (NITROSTAT) 0.4 MG SL tablet Place 0.4 mg under the tongue every 5 (five) minutes as needed. 07/29/15 07/28/16  [provider]    Inpatient Medications: Scheduled Meds: . atorvastatin  80 mg Oral QHS  . cholecalciferol  1,000 Units Oral Daily  . DULoxetine  60 mg Oral Daily  . enoxaparin (LOVENOX) injection  40 mg Subcutaneous Q24H  .  furosemide  40 mg Intravenous Q12H  . isosorbide mononitrate  30 mg Oral Daily  . losartan  50 mg Oral Daily  . metoprolol tartrate  50 mg Oral BID  . mometasone-formoterol  2 puff Inhalation BID  . nicotine  7 mg Transdermal Daily  . pantoprazole  40 mg Oral Daily  . sodium chloride flush  10-40 mL Intracatheter Q12H  . sodium chloride flush  3 mL Intravenous Q12H   Continuous Infusions: . sodium chloride     PRN Meds: sodium chloride, acetaminophen, albuterol, benzonatate, nitroGLYCERIN, ondansetron (ZOFRAN) IV,  oxyCODONE-acetaminophen, sodium chloride flush, sodium chloride flush  Allergies:   No Known Allergies  Social History:   Social History   Socioeconomic History  . Marital status: Divorced    Spouse name: Not on file  . Number of children: Not on file  . Years of education: Not on file  . Highest education level: Not on file  Occupational History  . Not on file  Tobacco Use  . Smoking status: Current Every Day Smoker    Packs/day: 0.50    Types: Cigarettes  . Smokeless tobacco: Never Used  Substance and Sexual Activity  . Alcohol use: No  . Drug use: No  . Sexual activity: Not on file  Other Topics Concern  . Not on file  Social History Narrative  . Not on file   Social Determinants of Health   Financial Resource Strain:   . Difficulty of Paying Living Expenses:   Food Insecurity:   . Worried About Charity fundraiser in the Last Year:   . Arboriculturist in the Last Year:   Transportation Needs:   . Film/video editor (Medical):   Marland Kitchen Lack of Transportation (Non-Medical):   Physical Activity:   . Days of Exercise per Week:   . Minutes of Exercise per Session:   Stress:   . Feeling of Stress :   Social Connections:   . Frequency of Communication with Friends and Family:   . Frequency of Social Gatherings with Friends and Family:   . Attends Religious Services:   . Active Member of Clubs or Organizations:   . Attends Archivist Meetings:   Marland Kitchen Marital Status:   Intimate Partner Violence:   . Fear of Current or Ex-Partner:   . Emotionally Abused:   Marland Kitchen Physically Abused:   . Sexually Abused:     Family History:   History reviewed. No pertinent family history.  Denies family history of heart disease  ROS:  Please see the history of present illness.   All other ROS reviewed and negative.     Physical Exam/Data:   Vitals:   07/30/19 1200 07/30/19 1246 07/30/19 1247 07/30/19 1248  BP: (!) 172/151 (!) 182/85    Pulse: 68 69    Resp: (!) 21 20     Temp:      TempSrc:      SpO2: 100% 99% 100% 99%  Weight:      Height:        Intake/Output Summary (Last 24 hours) at 07/30/2019 1337 Last data filed at 07/30/2019 1248 Gross per 24 hour  Intake --  Output 2600 ml  Net -2600 ml   Last 3 Weights 07/30/2019 11/16/2016 09/04/2016  Weight (lbs) 284 lb 285 lb 275 lb  Weight (kg) 128.822 kg 129.275 kg 124.739 kg     Body mass index is 39.61 kg/m.  General:  Well nourished, well developed, in no acute  distress HEENT: normal Lymph: no adenopathy Neck: Difficult to assess due to body habitus Endocrine:  No thryomegaly Vascular: No carotid bruits; FA pulses 2+ bilaterally without bruits  Cardiac:  normal S1, S2; RRR; no murmur  Lungs: Crackles at lung bases Abd: soft, nontender, distended Ext: 1-2+ edema Musculoskeletal:  No deformities, BUE and BLE strength normal and equal Skin: warm and dry  Neuro:  CNs 2-12 intact, no focal abnormalities noted Psych:  Normal affect   EKG:  The EKG was personally reviewed and demonstrates: Sinus rhythm Telemetry:  Telemetry was personally reviewed and demonstrates: Sinus bradycardia heart rate 55  Relevant CV Studies: Echocardiogram pending  Laboratory Data:  High Sensitivity Troponin:   Recent Labs  Lab 07/30/19 0521 07/30/19 0837  TROPONINIHS 22* 37*     Chemistry Recent Labs  Lab 07/30/19 0521  NA 142  K 5.1  CL 111  CO2 25  GLUCOSE 150*  BUN 19  CREATININE 1.26*  CALCIUM 9.3  GFRNONAA 59*  GFRAA >60  ANIONGAP 6    Recent Labs  Lab 07/30/19 0521  PROT 7.4  ALBUMIN 4.0  AST 67*  ALT 57*  ALKPHOS 54  BILITOT 1.3*   Hematology Recent Labs  Lab 07/30/19 0521  WBC 7.3  RBC 5.28  HGB 15.1  HCT 46.9  MCV 88.8  MCH 28.6  MCHC 32.2  RDW 13.7  PLT 193   BNP Recent Labs  Lab 07/30/19 0521  BNP 614.0*    DDimer No results for input(s): DDIMER in the last 168 hours.   Radiology/Studies:  DG Chest Port 1 View  Result Date: 07/30/2019 CLINICAL DATA:   Dyspnea EXAM: PORTABLE CHEST 1 VIEW COMPARISON:  11/16/2016 FINDINGS: Low volume chest which accentuates the mildly enlarged heart size. Interstitial coarsening on both sides. No visible effusion or pneumothorax. IMPRESSION: CHF pattern Electronically Signed   By: Monte Fantasia M.D.   On: 07/30/2019 05:21   US Abdomen Limited RUQ  Result Date: 07/30/2019 CLINICAL DATA:  Transaminitis. EXAM: ULTRASOUND ABDOMEN LIMITED RIGHT UPPER QUADRANT COMPARISON:  September 12, 2013. FINDINGS: Gallbladder: No gallstones or wall thickening visualized. No sonographic Murphy sign noted by sonographer. Common bile duct: Diameter: 3 mm which is within normal limits. Liver: No focal lesion identified. Mildly increased echogenicity of hepatic parenchyma is noted suggesting hepatic steatosis or other diffuse hepatocellular disease. Portal vein is patent on color Doppler imaging with normal direction of blood flow towards the liver. Other: None. IMPRESSION: Mildly increased echogenicity of hepatic parenchyma is noted suggesting hepatic steatosis or other diffuse hepatocellular disease. No other abnormality seen in the right upper quadrant of the abdomen. Electronically Signed   By: Marijo Conception M.D.   On: 07/30/2019 11:37        Assessment and Plan:   1.  Dyspnea on exertion, edema -Symptoms improved after BiPAP and Lasix -Continue IV Lasix 40 mg twice daily -Get echocardiogram to evaluate systolic and diastolic function -If there is systolic dysfunction, will plan for left heart cath and titrate as per guideline directed medical therapy.  2. Hx of htn -cont pta losartan, lopressor -lasix as above  3. Hx of osa -recommend cpap qhs  4. Current Tobacco use x 50+yrs -cessation advised    Signed, Kate Sable, MD  07/30/2019 1:37 PM

## 2019-07-30 NOTE — ED Provider Notes (Signed)
[facility] Emergency Department Provider Note  ____________________________________________   First MD Initiated Contact with Patient 07/30/19 [phone]     (approximate)  I have reviewed the triage vital signs and the nursing notes.   HISTORY  Chief Complaint Shortness of Breath    HPI Albert Johnson is a 69 y.o. male with below list of previous medical conditions including COPD hyperlipidemia hypertension and CHF per the patient's chart however the patient states that he has no history of CHF presents to the emergency department via EMS secondary to acute onset of dyspnea this morning at 1 AM that woke the patient from sleep.  Patient states that he had no complaints before going to bed.  Per EMS on their arrival patient's oxygen saturation was 70% on room air with obvious respiratory difficulty.  Nonrebreather was applied with patient's oxygen saturation improving to high 90s.  On arrival to the emergency department patient with continued respiratory difficulty.        Past Medical History:  Diagnosis Date  . Arthritis   . CHF (congestive heart failure) ([place])   . COPD (chronic obstructive pulmonary disease) ([place])   . Hyperlipidemia   . Hypertension   . Renal disorder     Patient Active Problem List   Diagnosis Date Noted  . Acute CHF (congestive heart failure) ([place]) 07/30/2019  . Hypertension   . COPD (chronic obstructive pulmonary disease) ([place])   . Nicotine dependence   . Acute on chronic respiratory failure with hypoxia [facility])     Past Surgical History:  Procedure Laterality Date  . CORONARY ARTERY BYPASS GRAFT    . REPLACEMENT TOTAL KNEE      Prior to Admission medications   Medication Sig Start Date End Date Taking? Authorizing Provider  albuterol (PROVENTIL HFA;VENTOLIN HFA) 108 (90 Base) MCG/ACT inhaler Inhale 2 puffs into the lungs every 6 (six) hours as needed for wheezing.    Yes [provider]  aspirin 81 MG EC tablet  Take by mouth. 05/27/18  Yes [provider]  atorvastatin (LIPITOR) 10 MG tablet Take 10 mg by mouth daily.   Yes [provider]  budesonide-formoterol (SYMBICORT) 80-4.5 MCG/ACT inhaler Inhale 2 puffs into the lungs 2 (two) times daily. 03/28/12  Yes [provider]  Cholecalciferol (VITAMIN D-1000 MAX ST) 1000 units tablet Take 1,000 mg by mouth daily.   Yes [provider]  metoprolol succinate (TOPROL-XL) 100 MG 24 hr tablet Take 100 mg by mouth daily. 05/14/19  Yes [provider]  nitroGLYCERIN (NITROSTAT) 0.4 MG SL tablet Place 0.4 mg under the tongue every 5 (five) minutes as needed. 07/29/15 07/30/19 Yes [provider]  omeprazole (PRILOSEC) 20 MG capsule Take 20 mg by mouth daily.   Yes [provider]  sildenafil (VIAGRA) 100 MG tablet  07/19/19  Yes [provider]  NARCAN 4 MG/0.1ML LIQD nasal spray kit SMARTSIG:1 Spray(s) Both Nares Once PRN 05/27/19   [provider]    Allergies Patient has no known allergies.  History reviewed. No pertinent family history.  Social History Social History   Tobacco Use  . Smoking status: Current Every Day Smoker    Packs/day: 0.50    Types: Cigarettes  . Smokeless tobacco: Never Used  Substance Use Topics  . Alcohol use: No  . Drug use: No    Review of Systems Constitutional: No fever/chills Eyes: No visual changes. ENT: No sore throat. Cardiovascular: Denies chest pain. Respiratory: Positive for shortness of breath. Gastrointestinal:  No abdominal pain.  No nausea, no vomiting.  No diarrhea.  No constipation. Genitourinary: Negative for dysuria. Musculoskeletal: Negative for neck pain.  Negative for back pain. Integumentary: Negative for rash. Neurological: Negative for headaches, focal weakness or numbness.  ____________________________________________   PHYSICAL EXAM:  VITAL SIGNS: ED Triage Vitals [07/30/19 0508]  Enc Vitals Group     BP       Pulse      Resp      Temp      Temp src      SpO2 98 %     Weight      Height      Head Circumference      Peak Flow      Pain Score      Pain Loc      Pain Edu?      Excl. in [place]?     Constitutional: Alert and oriented.  Apparent respiratory distress Eyes: Conjunctivae are normal.  Mouth/Throat: Patient is wearing a mask. Neck: No stridor.  No meningeal signs.   Cardiovascular: Normal rate, regular rhythm. Good peripheral circulation. Grossly normal heart sounds. Respiratory: Tachypnea, positive accessory respiratory muscle use, bibasilar Rales/rhonchi Gastrointestinal: Soft and nontender. No distention.  Musculoskeletal: 1+ bilateral lower extremity pitting edema Neurologic:  Normal speech and language. No gross focal neurologic deficits are appreciated.  Skin:  Skin is warm, dry and intact. Psychiatric: Mood and affect are normal. Speech and behavior are normal.  ____________________________________________   LABS (all labs ordered are listed, but only abnormal results are displayed)  Labs Reviewed  BRAIN NATRIURETIC PEPTIDE - Abnormal; Notable for the following components:      Result Value   B Natriuretic Peptide 614.0 (*)    All other components within normal limits  COMPREHENSIVE METABOLIC PANEL - Abnormal; Notable for the following components:   Glucose, Bld 150 (*)    Creatinine, Ser 1.26 (*)    AST 67 (*)    ALT 57 (*)    Total Bilirubin 1.3 (*)    GFR calc non Af Amer 59 (*)    All other components within normal limits  TROPONIN I (HIGH SENSITIVITY) - Abnormal; Notable for the following components:   Troponin I (High Sensitivity) 22 (*)    All other components within normal limits  TROPONIN I (HIGH SENSITIVITY) - Abnormal; Notable for the following components:   Troponin I (High Sensitivity) 37 (*)    All other components within normal limits  TROPONIN I (HIGH SENSITIVITY) - Abnormal; Notable for the following components:   Troponin I (High Sensitivity)  44 (*)    All other components within normal limits  SARS CORONAVIRUS 2 BY RT PCR (HOSPITAL ORDER, [facility])  CBC WITH DIFFERENTIAL/PLATELET  HIV ANTIBODY (ROUTINE TESTING W REFLEX)  BASIC METABOLIC PANEL   ____________________________________________  EKG  ED ECG REPORT I, [place] Jer[place]o Cieslik, the attending physician, personally viewed and interpreted this ECG.   Date: 07/30/2019  EKG Time: 5:05 AM  Rate: 76  Rhythm: Normal sinus rhythm LVH and left atrial enlargement  Axis: Normal  Intervals: Normal  ST&T Change: None  RADIOLOGY I, [place] Umberto Pavek, personally viewed and evaluated these images (plain radiographs) as part of my medical decision making, as well as reviewing the written report by the radiologist.  ED MD interpretation: CHF pattern on chest x-ray per radiologist.  Official radiology report(s): DG Chest Port 1 View  Result Date: 07/30/2019 CLINICAL DATA:  Dyspnea EXAM: PORTABLE CHEST  1 VIEW COMPARISON:  11/16/2016 FINDINGS: Low volume chest which accentuates the mildly enlarged heart size. Interstitial coarsening on both sides. No visible effusion or pneumothorax. IMPRESSION: CHF pattern Electronically Signed   By: Monte Fantasia M.D.   On: 07/30/2019 05:21   US Abdomen Limited RUQ  Result Date: 07/30/2019 CLINICAL DATA:  Transaminitis. EXAM: ULTRASOUND ABDOMEN LIMITED RIGHT UPPER QUADRANT COMPARISON:  September 12, 2013. FINDINGS: Gallbladder: No gallstones or wall thickening visualized. No sonographic Murphy sign noted by sonographer. Common bile duct: Diameter: 3 mm which is within normal limits. Liver: No focal lesion identified. Mildly increased echogenicity of hepatic parenchyma is noted suggesting hepatic steatosis or other diffuse hepatocellular disease. Portal vein is patent on color Doppler imaging with normal direction of blood flow towards the liver. Other: None. IMPRESSION: Mildly increased echogenicity of hepatic parenchyma is noted  suggesting hepatic steatosis or other diffuse hepatocellular disease. No other abnormality seen in the right upper quadrant of the abdomen. Electronically Signed   By: Marijo Conception M.D.   On: 07/30/2019 11:37    ____________________________________________   PROCEDURES   Procedure(s) performed (including Critical Care):  .Critical Care Performed by: Gregor Hams, MD Authorized by: Gregor Hams, MD   Critical care provider statement:    Critical care time (minutes):  30   Critical care time was exclusive of:  Separately billable procedures and treating other patients   Critical care was necessary to treat or prevent imminent or life-threatening deterioration of the following conditions:  Respiratory failure   Critical care was time spent personally by me on the following activities:  Development of treatment plan with patient or surrogate, discussions with consultants, evaluation of patient's response to treatment, examination of patient, obtaining history from patient or surrogate, ordering and performing treatments and interventions, ordering and review of laboratory studies, ordering and review of radiographic studies, pulse oximetry, re-evaluation of patient's condition and review of old charts     ____________________________________________   INITIAL IMPRESSION / MDM / [place] / ED COURSE  As part of my medical decision making, I reviewed the following data within the electronic MEDICAL RECORD NUMBER  69 year old male presented with above-stated history and physical exam a differential diagnosis including but not limited to acute pulmonary edema, CHF, demand ischemia, ACS.  Patient markedly hypertensive on arrival and as such 2 inch of Nitropaste was applied as well as 2 sublingual nitroglycerin. Patient also given 40 mg of IV Lasix with excellent urine production following.  Given patient's increased work of breathing BiPAP was applied.  After the before  mentioned interventions on reevaluation patient states that he feels "much better".  Blood pressure markedly improved as well as work of breathing patient no longer tachypneic.  Chest x-ray findings were consistent with congestive heart failure.  Patient and his wife informed of all clinical findings.  Patient discussed with Dr. Sidney Ace hospitalist for admission for further evaluation and management  ____________________________________________  FINAL CLINICAL IMPRESSION(S) / ED DIAGNOSES  Acute congestive heart failure  MEDICATIONS GIVEN DURING THIS VISIT:  Medications  sodium chloride flush (NS) 0.9 % injection 10-40 mL (10 mLs Intracatheter Given 07/30/19 1713)  sodium chloride flush (NS) 0.9 % injection 10-40 mL (has no administration in time range)  oxyCODONE-acetaminophen (PERCOCET) 7.5-325 MG per tablet 1 tablet (1 tablet Oral Given 07/30/19 2204)  atorvastatin (LIPITOR) tablet 80 mg (80 mg Oral Given 07/30/19 2204)  isosorbide mononitrate (IMDUR) 24 hr tablet 30 mg (30 mg Oral Given 07/30/19 1415)  losartan (  COZAAR) tablet 50 mg (50 mg Oral Given 07/30/19 1416)  metoprolol tartrate (LOPRESSOR) tablet 50 mg (50 mg Oral Not Given 07/30/19 2206)  nitroGLYCERIN (NITROSTAT) SL tablet 0.4 mg (has no administration in time range)  DULoxetine (CYMBALTA) DR capsule 60 mg (60 mg Oral Given 07/30/19 1415)  pantoprazole (PROTONIX) EC tablet 40 mg (40 mg Oral Given 07/30/19 1416)  cholecalciferol (VITAMIN D3) tablet 1,000 Units (1,000 Units Oral Given 07/30/19 1416)  benzonatate (TESSALON) capsule 100 mg (has no administration in time range)  mometasone-formoterol (DULERA) 100-5 MCG/ACT inhaler 2 puff (2 puffs Inhalation Given 07/30/19 1414)  sodium chloride flush (NS) 0.9 % injection 3 mL (3 mLs Intravenous Not Given 07/30/19 1110)  sodium chloride flush (NS) 0.9 % injection 3 mL (has no administration in time range)  0.9 %  sodium chloride infusion (has no administration in time range)  acetaminophen  (TYLENOL) tablet 650 mg (has no administration in time range)  ondansetron (ZOFRAN) injection 4 mg (has no administration in time range)  enoxaparin (LOVENOX) injection 40 mg (40 mg Subcutaneous Given 07/30/19 2204)  furosemide (LASIX) injection 40 mg (40 mg Intravenous Given 07/30/19 1855)  nicotine (NICODERM CQ - dosed in mg/24 hr) patch 7 mg (7 mg Transdermal Patch Applied 07/30/19 1418)  albuterol (PROVENTIL) (2.5 MG/3ML) 0.083% nebulizer solution 2.5 mg (has no administration in time range)  nitroGLYCERIN (NITROGLYN) 2 % ointment 1 inch (1 inch Topical Given 07/30/19 0527)  furosemide (LASIX) injection 40 mg (40 mg Intravenous Given 07/30/19 1540)     ED Discharge Orders    None      *Please note:  Nissan Pelly was evaluated in Emergency Department on 07/30/2019 for the symptoms described in the history of present illness. He was evaluated in the context of the global COVID-19 pandemic, which necessitated consideration that the patient might be at risk for infection with the SARS-CoV-2 virus that causes COVID-19. Institutional protocols and algorithms that pertain to the evaluation of patients at risk for COVID-19 are in a state of rapid change based on information released by regulatory bodies including the CDC and federal and state organizations. These policies and algorithms were followed during the patient's care in the ED.  Some ED evaluations and interventions may be delayed as a result of limited staffing during the pandemic.*  Note:  This document was prepared using Dragon voice recognition software and may include unintentional dictation errors.   Gregor Hams, MD 07/30/19 2209

## 2019-07-30 NOTE — ED Notes (Signed)
IV Team at bedside for additional IV placement

## 2019-07-30 NOTE — ED Notes (Signed)
Pt provided w/breakfast tray.

## 2019-07-30 NOTE — Progress Notes (Signed)
   07/30/19 2136  Vitals  Temp 98.4 F (36.9 C)  Temp Source Oral  BP (!) 166/79  MAP (mmHg) 100  BP Location Right Arm  BP Method Automatic  Patient Position (if appropriate) Sitting  Pulse Rate (!) 57  Oxygen Therapy  SpO2 99 %  O2 Device Nasal Cannula  O2 Flow Rate (L/min) 2 L/min  Height and Weight  Height 5\' 11"  (1.803 m)  Weight 130.1 kg  Type of Scale Used Standing  BSA (Calculated - sq m) 2.55 sq meters  BMI (Calculated) 40.02  Weight in (lb) to have BMI = 25 178.9  MEWS Score  MEWS Temp 0  MEWS Systolic 0  MEWS Pulse 0  MEWS RR 0  MEWS LOC 0  MEWS Score 0  MEWS Score Color Green   The patient is admitted to 2A 234 with the diagnosis of CHF. A & O x 4. Patient oriented to his room, call bell, ascom and staff. Full assessment to epic completed. Will continue to monitor.

## 2019-07-30 NOTE — ED Triage Notes (Signed)
Pt from home to ED via [facility] for [facility]. Pt st he woke up at home around 01:30 unable to breath. Pt st he has not been able to use his CPAP for the last 4-6 weeks due to a delay in cleaning equipment for it.   Pmhx of HTN. Pt denies hx of CHF/ COPD. Pt reports taking his meds as prescribed  - Metoprolol -Omeprazole -ASA -atorvastatin   Pt's RA stat per fire dept was 70%- pt was placed on 15L NR which brought his O2 to 97%. Pt quickly destats on RA.

## 2019-07-30 NOTE — ED Notes (Signed)
Provided pt w/ ginger ale. Pt on phone talking w/ family.

## 2019-07-30 NOTE — TOC Initial Note (Signed)
Transition of Care [facility]) - Initial/Assessment Note    Patient Details  Name: Albert Johnson MRN: [id number] Date of Birth: September 14, 1950  Transition of Care [facility]) CM/SW Contact:    Otwell Cellar, RN Phone Number: 07/30/2019, 10:52 AM  Clinical Narrative:                 Spoke with patient at bedside. Patient states he lives with his fiance who assists him to appointments and assists with food and ADL's as needed. Patient states he has no problems obtaining his medications. No current TOC needs-will follow for additional needs.  Expected Discharge Plan: Home w Home Health Services Barriers to Discharge: Continued Medical Work up   Patient Goals and CMS Choice Patient states their goals for this hospitalization and ongoing recovery are:: Get better and get home      Expected Discharge Plan and Services Expected Discharge Plan: Home w Home Health Services       Living arrangements for the past 2 months: Single Family Home                                      Prior Living Arrangements/Services Living arrangements for the past 2 months: Single Family Home Lives with:: Significant Other Patient language and need for interpreter reviewed:: Yes Do you feel safe going back to the place where you live?: Yes      Need for Family Participation in Patient Care: Yes (Comment) Care giver support system in place?: Yes (comment) Current home services: DME(cane) Criminal Activity/Legal Involvement Pertinent to Current Situation/Hospitalization: No - Comment as needed  Activities of Daily Living      Permission Sought/Granted Permission sought to share information with : Case Manager Permission granted to share information with : Yes, Verbal Permission Granted  Share Information with NAME: TOC Dept           Emotional Assessment Appearance:: Appears stated age Attitude/Demeanor/Rapport: Engaged Affect (typically observed): Accepting Orientation: : Oriented to Self,  Oriented to Place, Oriented to  Time, Oriented to Situation Alcohol / Substance Use: Tobacco Use(quit smoking 3 weeks ago) Psych Involvement: No (comment)  Admission diagnosis:  Acute CHF (congestive heart failure) (HCC) [I50.9] Patient Active Problem List   Diagnosis Date Noted  . Acute CHF (congestive heart failure) (HCC) 07/30/2019  . Hypertension   . COPD (chronic obstructive pulmonary disease) (HCC)   . Nicotine dependence   . Acute on chronic respiratory failure with hypoxia (HCC)    PCP:  Corliss Parish, MD Pharmacy:   [facility] [phone] - [place],  - [address] AT [facility] [address] [place] [address] [place] [address] Phone: [phone] Fax: [phone]     Social Determinants of Health (SDOH) Interventions    Readmission Risk Interventions No flowsheet data found.

## 2019-07-30 NOTE — ED Notes (Signed)
US at bedside

## 2019-07-30 NOTE — ED Notes (Signed)
Order to place pt on BIPAP at this time. RT at bedside to placed at this time

## 2019-07-30 NOTE — ED Notes (Signed)
Pt has dinner tray at bedside. Pt's girlfriend visiting at bedside.

## 2019-07-30 NOTE — H&P (Signed)
History and Physical    Albert Johnson NWG:[id number] DOB: 09/19/1950 DOA: 07/30/2019  PCP: Rogelia Rohrer, MD   Patient coming from: Home  I have personally briefly reviewed patient's old medical records in [place]  Chief Complaint: Shortness of breath   HPI: Albert Johnson is a 69 y.o. male with medical history significant for hypertension, COPD and nicotine dependence who was brought into the emergency room via EMS for evaluation of shortness of breath which woke him up on the morning of his admission.  Patient states that he was in his usual state of health and had gone to bed only to wake up suddenly at about 1 AM with difficulty breathing and chest tightness.  He had nausea, diaphoresis and 1 episode of emesis.  He denies having any palpitations, no changes in his bowel habits, no fever, no chills, no cough, no dizziness or lightheadedness.  Per EMS on their arrival patient's oxygen saturation was 70% on room air with obvious respiratory difficulty.  Nonrebreather was applied with patient's oxygen saturation improving to high 90s.  On arrival to the emergency department patient with continued respiratory difficulty and was placed on BIPAP.  His blood pressure was also elevated upon arrival to the emergency room.  Thank you so much for your help Santiago Glad I really appreciate Chest x ray shows low volume chest which accentuates the mildly enlarged heart size. Interstitial coarsening on both sides. No visible effusion or pneumothorax. 12 Lead EKG shows sinus bradycardia with diffuse T wave inversions  ED Course: Patient seen in the emergency room for acute onset shortness of breath and hypoxia requiring noninvasive mechanical ventilation for increased work of breathing.  Blood pressure was elevated upon arrival to the emergency room and chest x-ray is consistent with CHF.  Will admit patient to the hospital for further evaluation.  Review of Systems: As per HPI otherwise 10 point review of  systems negative.    Past Medical History:  Diagnosis Date  . Arthritis   . CHF (congestive heart failure) ([place])   . COPD (chronic obstructive pulmonary disease) ([place])   . Hyperlipidemia   . Hypertension   . Renal disorder     Past Surgical History:  Procedure Laterality Date  . CORONARY ARTERY BYPASS GRAFT    . REPLACEMENT TOTAL KNEE       reports that he has been smoking cigarettes. He has been smoking about 0.50 packs per day. He has never used smokeless tobacco. He reports that he does not drink alcohol or use drugs.  No Known Allergies  History reviewed. No pertinent family history.   Prior to Admission medications   Medication Sig Start Date End Date Taking? Authorizing Provider  aspirin 81 MG EC tablet Take by mouth. 05/27/18  Yes [provider]  losartan-hydrochlorothiazide (HYZAAR) 100-25 MG tablet losartan 100 mg-hydrochlorothiazide 25 mg tablet 01/17/19  Yes [provider]  metoprolol succinate (TOPROL-XL) 100 MG 24 hr tablet Take 100 mg by mouth daily. 05/14/19  Yes [provider]  sildenafil (VIAGRA) 100 MG tablet  07/19/19  Yes [provider]  albuterol (PROVENTIL HFA;VENTOLIN HFA) 108 (90 Base) MCG/ACT inhaler Inhale 2 puffs into the lungs every 6 (six) hours as needed for wheezing.     [provider]  budesonide-formoterol (SYMBICORT) 80-4.5 MCG/ACT inhaler Inhale 2 puffs into the lungs 2 (two) times daily. 03/28/12   [provider]  Cholecalciferol (VITAMIN D-1000 MAX ST) 1000 units tablet Take 1,000 mg by mouth daily.  [provider]  NARCAN 4 MG/0.1ML LIQD nasal spray kit SMARTSIG:1 Spray(s) Both Nares Once PRN 05/27/19   [provider]  nitroGLYCERIN (NITROSTAT) 0.4 MG SL tablet Place 0.4 mg under the tongue every 5 (five) minutes as needed. 07/29/15 07/28/16  [provider]  oxyCODONE (OXY IR/ROXICODONE) 5 MG immediate release tablet oxycodone 5 mg tablet    [provider]  oxyCODONE-acetaminophen (PERCOCET) 7.5-325 MG tablet Take 1 tablet by mouth every 8 (eight) hours as needed for moderate pain.  09/25/15   [provider]  oxyCODONE-acetaminophen (ROXICET) 5-325 MG tablet Take 1 tablet by mouth every 4 (four) hours as needed for severe pain. 11/17/16   Gregor Hams, MD    Physical Exam: Vitals:   07/30/19 0745 07/30/19 0800 07/30/19 0815 07/30/19 0830  BP: (!) 162/87 (!) 157/79 (!) 150/81 (!) 154/83  Pulse: (!) 51 (!) 56 (!) 48 (!) 46  Resp: 16 16 19 20   Temp:      TempSrc:      SpO2: 100% 98% 100% 100%  Weight:      Height:         Vitals:   07/30/19 0745 07/30/19 0800 07/30/19 0815 07/30/19 0830  BP: (!) 162/87 (!) 157/79 (!) 150/81 (!) 154/83  Pulse: (!) 51 (!) 56 (!) 48 (!) 46  Resp: 16 16 19 20   Temp:      TempSrc:      SpO2: 100% 98% 100% 100%  Weight:      Height:        Constitutional: NAD, alert and oriented x 3.  BiPAP mask in place Eyes: PERRL, lids and conjunctivae normal ENMT: Mucous membranes are moist.  Neck: normal, supple, no masses, no thyromegaly Respiratory: Crackles at the bases, no wheezing, no crackles. Normal respiratory effort. No accessory muscle use.  Cardiovascular: Regular rate and rhythm, no murmurs / rubs / gallops. 2+ extremity edema. 2+ pedal pulses. No carotid bruits.  Abdomen: no tenderness, no masses palpated. No hepatosplenomegaly. Bowel sounds positive.  Central adiposity Musculoskeletal: no clubbing / cyanosis. No joint deformity upper and lower extremities.  Skin: no rashes, lesions, ulcers.  Neurologic: No gross focal neurologic deficit. Psychiatric: Normal mood and affect.   Labs on Admission: I have personally reviewed following labs and imaging studies  CBC: Recent Labs  Lab 07/30/19 0521  WBC 7.3  NEUTROABS 4.8  HGB 15.1  HCT 46.9  MCV 88.8  PLT 106   Basic Metabolic Panel: Recent Labs  Lab 07/30/19 0521  NA 142  K 5.1  CL 111  CO2 25  GLUCOSE 150*  BUN 19   CREATININE 1.26*  CALCIUM 9.3   GFR: Estimated Creatinine Clearance: 78.9 mL/min (A) (by C-G formula based on SCr of 1.26 mg/dL (H)). Liver Function Tests: Recent Labs  Lab 07/30/19 0521  AST 67*  ALT 57*  ALKPHOS 54  BILITOT 1.3*  PROT 7.4  ALBUMIN 4.0   No results for input(s): LIPASE, AMYLASE in the last 168 hours. No results for input(s): AMMONIA in the last 168 hours. Coagulation Profile: No results for input(s): INR, PROTIME in the last 168 hours. Cardiac Enzymes: No results for input(s): CKTOTAL, CKMB, CKMBINDEX, TROPONINI in the last 168 hours. BNP (last 3 results) No results for input(s): PROBNP in the last 8760 hours. HbA1C: No results for input(s): HGBA1C in the last 72 hours. CBG: No results for input(s): GLUCAP in the last 168 hours. Lipid Profile: No results for input(s): CHOL, HDL, LDLCALC, TRIG,  CHOLHDL, LDLDIRECT in the last 72 hours. Thyroid Function Tests: No results for input(s): TSH, T4TOTAL, FREET4, T3FREE, THYROIDAB in the last 72 hours. Anemia Panel: No results for input(s): VITAMINB12, FOLATE, FERRITIN, TIBC, IRON, RETICCTPCT in the last 72 hours. Urine analysis:    Component Value Date/Time   COLORURINE AMBER (A) 09/30/2015 1908   APPEARANCEUR CLEAR 09/30/2015 1908   APPEARANCEUR Hazy 01/08/2014 1932   LABSPEC 1.026 09/30/2015 1908   LABSPEC 1.023 01/08/2014 1932   PHURINE 5.5 09/30/2015 1908   GLUCOSEU NEGATIVE 09/30/2015 1908   GLUCOSEU Negative 01/08/2014 1932   HGBUR NEGATIVE 09/30/2015 1908   BILIRUBINUR NEGATIVE 09/30/2015 1908   BILIRUBINUR Negative 01/08/2014 1932   KETONESUR NEGATIVE 09/30/2015 1908   PROTEINUR 30 (A) 09/30/2015 1908   NITRITE NEGATIVE 09/30/2015 1908   LEUKOCYTESUR NEGATIVE 09/30/2015 1908   LEUKOCYTESUR Negative 01/08/2014 1932    Radiological Exams on Admission: DG Chest Port 1 View  Result Date: 07/30/2019 CLINICAL DATA:  Dyspnea EXAM: PORTABLE CHEST 1 VIEW COMPARISON:  11/16/2016 FINDINGS: Low volume  chest which accentuates the mildly enlarged heart size. Interstitial coarsening on both sides. No visible effusion or pneumothorax. IMPRESSION: CHF pattern Electronically Signed   By: Monte Fantasia M.D.   On: 07/30/2019 05:21    EKG: Independently reviewed.  Sinus Bradycardia T wave inversions in the antero lateral leads  Assessment/Plan Principal Problem:   Acute CHF (congestive heart failure) (HCC) Active Problems:   Hypertension   COPD (chronic obstructive pulmonary disease) (HCC)   Nicotine dependence   Acute on chronic respiratory failure with hypoxia (HCC)    Acute CHF Etiology unclear and may be secondary to diastolic dysfunction We will start patient on Lasix 40 mg IV every 12 Optimize blood pressure control Continue losartan and nitrates Continue metoprolol with holding parameters Will obtain 2D echocardiogram to assess LVEF We will request cardiology consult  Acute hypoxic respiratory failure Secondary to acute CHF exacerbation Patient was hypoxic in the field with pulse oximetry of 70% with increased work of breathing requiring BiPAP Will attempt to wean off BiPAP as tolerated   Hypertension Uncontrolled blood pressure Patient states that his medications were recently adjusted Continue metoprolol with holding parameters and losartan Uptitrate medications as needed to optimize blood pressure control   Nicotine dependence Smoking cessation has been discussed with patient in detail We will place patient on nicotine transdermal patch 7 mcg daily   Morbid obesity Complicates overall prognosis and care   COPD Not in acute exacerbation Continue as needed bronchodilator therapy and inhaled steroids   Transaminitis Unclear etiology and may be medication induced We will obtain right upper quadrant ultrasound   DVT prophylaxis: Lovenox Code Status: Full Family Communication: Greater than 50% of time was spent discussing plan of care with patient at the  bedside.  All questions and concerns have been addressed Disposition Plan: Back to previous home environment Consults called: Cardiology    Collier Bullock MD [facility]     07/30/2019, 9:43 AM

## 2019-07-30 NOTE — ED Notes (Addendum)
EDP notified about pts change in HR (bradycardia: 45-52)  Verbal orders to repeat EKG

## 2019-07-31 ENCOUNTER — Inpatient Hospital Stay (HOSPITAL_COMMUNITY)
Admit: 2019-07-31 | Discharge: 2019-07-31 | Disposition: A | Discharge status: Home / Self Care | Payer: Medicare Other | Attending: Internal Medicine | Admitting: Internal Medicine

## 2019-07-31 DIAGNOSIS — I110 Hypertensive heart disease with heart failure: Principal | ICD-10-CM

## 2019-07-31 DIAGNOSIS — J432 Centrilobular emphysema: Secondary | ICD-10-CM

## 2019-07-31 DIAGNOSIS — I5031 Acute diastolic (congestive) heart failure: Secondary | ICD-10-CM

## 2019-07-31 DIAGNOSIS — K219 Gastro-esophageal reflux disease without esophagitis: Secondary | ICD-10-CM

## 2019-07-31 DIAGNOSIS — J9601 Acute respiratory failure with hypoxia: Secondary | ICD-10-CM

## 2019-07-31 DIAGNOSIS — E785 Hyperlipidemia, unspecified: Secondary | ICD-10-CM

## 2019-07-31 DIAGNOSIS — R531 Weakness: Secondary | ICD-10-CM

## 2019-07-31 DIAGNOSIS — J449 Chronic obstructive pulmonary disease, unspecified: Secondary | ICD-10-CM

## 2019-07-31 LAB — BASIC METABOLIC PANEL
Anion gap: 7 (ref 5–15)
BUN: 20 mg/dL (ref 8–23)
CO2: 29 mmol/L (ref 22–32)
Calcium: 9 mg/dL (ref 8.9–10.3)
Chloride: 105 mmol/L (ref 98–111)
Creatinine, Ser: 1.36 mg/dL — ABNORMAL HIGH (ref 0.61–1.24)
GFR calc Af Amer: >60 mL/min (ref >60)
GFR calc non Af Amer: 54 mL/min — ABNORMAL LOW (ref >60)
Glucose, Bld: 123 mg/dL — ABNORMAL HIGH (ref 70–99)
Potassium: 3.5 mmol/L (ref 3.5–5.1)
Sodium: 141 mmol/L (ref 135–145)

## 2019-07-31 LAB — ECHOCARDIOGRAM COMPLETE
Height: 71 in
Weight: 4622.4 oz

## 2019-07-31 MED ORDER — LOSARTAN POTASSIUM 50 MG PO TABS
50.0000 mg | ORAL_TABLET | Freq: Once | ORAL | Status: AC
  Administered 2019-07-31: 50 mg via ORAL
  Filled 2019-07-31: qty 1

## 2019-07-31 MED ORDER — LOSARTAN POTASSIUM 50 MG PO TABS
100.0000 mg | ORAL_TABLET | Freq: Every day | ORAL | Status: DC
  Administered 2019-08-01: 100 mg via ORAL
  Filled 2019-07-31: qty 2

## 2019-07-31 MED ORDER — ISOSORBIDE MONONITRATE ER 30 MG PO TB24
30.0000 mg | ORAL_TABLET | Freq: Once | ORAL | Status: AC
  Administered 2019-07-31: 30 mg via ORAL
  Filled 2019-07-31: qty 1

## 2019-07-31 MED ORDER — HYDRALAZINE HCL 10 MG PO TABS
10.0000 mg | ORAL_TABLET | Freq: Three times a day (TID) | ORAL | Status: DC
  Administered 2019-07-31 – 2019-08-01 (×3): 10 mg via ORAL
  Filled 2019-07-31 (×5): qty 1

## 2019-07-31 MED ORDER — ISOSORBIDE MONONITRATE ER 60 MG PO TB24
60.0000 mg | ORAL_TABLET | Freq: Every day | ORAL | Status: DC
  Administered 2019-08-01: 60 mg via ORAL
  Filled 2019-07-31: qty 1

## 2019-07-31 MED ORDER — FUROSEMIDE 10 MG/ML IJ SOLN
40.0000 mg | Freq: Two times a day (BID) | INTRAMUSCULAR | Status: DC
  Administered 2019-07-31 – 2019-08-01 (×2): 40 mg via INTRAVENOUS
  Filled 2019-07-31 (×2): qty 4

## 2019-07-31 NOTE — Plan of Care (Signed)
  Problem: Education: Goal: Ability to demonstrate management of disease process will improve Outcome: Progressing Goal: Individualized Educational Video(s) Outcome: Progressing   Problem: Education: Goal: Ability to verbalize understanding of medication therapies will improve Note: Patient has reviewed heart failure booklet and magnet. Patient has no questions at this time.

## 2019-07-31 NOTE — TOC Progression Note (Signed)
Transition of Care [facility]) - Progression Note    Patient Details  Name: Albert Johnson MRN: [id number] Date of Birth: April 17, 1950  Transition of Care [facility]) CM/SW Contact  Trenton Founds, RN Phone Number: 07/31/2019, 12:44 PM  Clinical Narrative:   RNCM spoke with patient at bedside. Patient reports that he is very familiar with HF management, he weighs himself daily and understands that if he gains 2-3 lbs in a day or 5 lbs in a week she should make his MD aware. He reports that he does not got to HF clinic but sees his cardiologist who is in [place]. Patient reports that he does not feel he has any other needs at this time.  During progression rounds it was reported by MD that patient is having trouble with his c-pap, he is unable to clean it so he hasn't been able to use it.  Went back to room and patient does report that he is unable to use his c-pap due to being unable to clean it, that he has had it at least 10 years and the company he got it from is no longer in business.  Reached out to [place] with [employer] and he will speak with patient about possibility of getting new machine.     Expected Discharge Plan: Home w Home Health Services Barriers to Discharge: Continued Medical Work up  Expected Discharge Plan and Services Expected Discharge Plan: Home w Home Health Services       Living arrangements for the past 2 months: Single Family Home                                       Social Determinants of Health (SDOH) Interventions    Readmission Risk Interventions No flowsheet data found.

## 2019-07-31 NOTE — Progress Notes (Signed)
Does not qualify for REDS VEST, patients bmi is over 38. Current BMI is 40

## 2019-07-31 NOTE — Progress Notes (Signed)
*  PRELIMINARY RESULTS* Echocardiogram 2D Echocardiogram has been performed.  Albert Johnson 07/31/2019, 9:18 AM

## 2019-07-31 NOTE — Progress Notes (Signed)
Progress Note  Patient Name: Albert Johnson Date of Encounter: 07/31/2019  Primary Cardiologist: new to [facility] - consult by Agbor-Etang  Subjective   Dyspnea and lower extremity swelling are improving. No chest pain. He remains on supplemental oxygen via nasal cannula at 2 L. Documented UOP 3.3 L for the past 24 hours with a net - 5.6 L for the admission. Weight 130.1-->131 kg. BUN/SCr 19/1.26-->20/1.36, potassium 5.1-->3.5. Echo pending. BP remains elevated.   Inpatient Medications    Scheduled Meds: . atorvastatin  80 mg Oral QHS  . cholecalciferol  1,000 Units Oral Daily  . DULoxetine  60 mg Oral Daily  . enoxaparin (LOVENOX) injection  40 mg Subcutaneous Q24H  . furosemide  40 mg Intravenous Q12H  . isosorbide mononitrate  30 mg Oral Daily  . losartan  50 mg Oral Daily  . metoprolol tartrate  50 mg Oral BID  . mometasone-formoterol  2 puff Inhalation BID  . nicotine  7 mg Transdermal Daily  . pantoprazole  40 mg Oral Daily  . sodium chloride flush  10-40 mL Intracatheter Q12H  . sodium chloride flush  3 mL Intravenous Q12H   Continuous Infusions: . sodium chloride     PRN Meds: sodium chloride, acetaminophen, albuterol, benzonatate, nitroGLYCERIN, ondansetron (ZOFRAN) IV, oxyCODONE-acetaminophen, sodium chloride flush, sodium chloride flush   Vital Signs    Vitals:   07/31/19 0532 07/31/19 0735 07/31/19 0823 07/31/19 1010  BP: 135/71  (!) 176/78   Pulse: (!) 54 (!) 55 71   Resp:  17 20 17   Temp: 97.7 F (36.5 C)  97.9 F (36.6 C)   TempSrc: Oral  Oral   SpO2: 100% 94% 97%   Weight: 131 kg     Height:        Intake/Output Summary (Last 24 hours) at 07/31/2019 1154 Last data filed at 07/31/2019 0955 Gross per 24 hour  Intake --  Output 3275 ml  Net -3275 ml   Filed Weights   07/30/19 0523 07/30/19 2136 07/31/19 0532  Weight: 128.8 kg 130.1 kg 131 kg    Telemetry    Sinus rhythm with heart rates in the 40s to 60s bpm, 8 beta run of atrial tachycardia,  5 beat run of NSVT - Personally Reviewed  ECG    No new tracings - Personally Reviewed  Physical Exam   GEN: No acute distress.   Neck: JVD mildly elevated. Cardiac: RRR, no murmurs, rubs, or gallops.  Respiratory: Diminished breath sounds bilaterally with crackles along the bases. Supplemental oxygen via nasal cannula at 2 L. GI: Soft, nontender, non-distended.   MS: 1+ bilateral lower extremity pitting edema; No deformity. Neuro:  Alert and oriented x 3; Nonfocal.  Psych: Normal affect.  Labs    Chemistry Recent Labs  Lab 07/30/19 0521 07/31/19 0454  NA 142 141  K 5.1 3.5  CL 111 105  CO2 25 29  GLUCOSE 150* 123*  BUN 19 20  CREATININE 1.26* 1.36*  CALCIUM 9.3 9.0  PROT 7.4  --   ALBUMIN 4.0  --   AST 67*  --   ALT 57*  --   ALKPHOS 54  --   BILITOT 1.3*  --   GFRNONAA 59* 54*  GFRAA >60 >60  ANIONGAP 6 7     Hematology Recent Labs  Lab 07/30/19 0521  WBC 7.3  RBC 5.28  HGB 15.1  HCT 46.9  MCV 88.8  MCH 28.6  MCHC 32.2  RDW 13.7  PLT 193  Cardiac EnzymesNo results for input(s): TROPONINI in the last 168 hours. No results for input(s): TROPIPOC in the last 168 hours.   BNP Recent Labs  Lab 07/30/19 0521  BNP 614.0*     DDimer No results for input(s): DDIMER in the last 168 hours.   Radiology    DG Chest Port 1 View  Result Date: 07/30/2019 IMPRESSION: CHF pattern Electronically Signed   By: Marnee Spring M.D.   On: 07/30/2019 05:21   US Abdomen Limited RUQ  Result Date: 07/30/2019 IMPRESSION: Mildly increased echogenicity of hepatic parenchyma is noted suggesting hepatic steatosis or other diffuse hepatocellular disease. No other abnormality seen in the right upper quadrant of the abdomen. Electronically Signed   By: Lupita Raider M.D.   On: 07/30/2019 11:37    Cardiac Studies   2D echo pending  Patient Profile     69 y.o. male with history of HTN, HLD, COPD with ongoing tobacco use x 50 years and OSA on CPAP who we are  seeing of dyspnea.   Assessment & Plan    1. Acute CHF/acute hypoxic respiratory distress: -Etiology/type uncertain, await echo -Has been weaned from BiPAP to supplemental oxygen via nasal cannula at 2 L -Volume status is improving, though he remains volume up -Continue IV Lasix with monitoring of his renal function -Echo pending -Escalate GDMT as indicated based on echo results  -Optimal BP control  -CHF education -Strict I/O -Daily weights   2. Elevated high sensitivity troponin: -Mildly elevated and not consistent with ACS -No indication for heparin gtt at this time -Await echo for further recommendations on ischemic testing, if he is found to have a new cardiomyopathy will need R/LHC  3. HTN: -BP improving, though remains suboptimally controlled -Started on metoprolol this morning -Losartan, Imdur  4. AKI: -May need to hold losartan with diuresis   5. OSA: -CPAP  6. Tobacco use: -Complete cessation recommended   For questions or updates, please contact [facility] Please consult [email or web address] for contact info under Cardiology/STEMI.    Signed, Eula Listen, PA-C [facility] Pager: [phone] 07/31/2019, 11:54 AM

## 2019-07-31 NOTE — Progress Notes (Signed)
Patient ID: Albert Johnson, male   DOB: [birth year], 69 y.o.   MRN: [id number] [facility] PROGRESS NOTE  Albert Johnson WGN:[id number] DOB: [birth year] DOA: 07/30/2019 PCP: Corliss ParishMin, John, MD  HPI/Subjective: Patient came in with shortness of breath.  Breathing a little bit better now.  Required BiPAP initially.  Feels a little bit weak.  Wants to see how he feels moving around.  Objective: Vitals:   07/31/19 0823 07/31/19 1010  BP: (!) 176/78   Pulse: 71   Resp: 20 17  Temp: 97.9 F (36.6 C)   SpO2: 97%     Intake/Output Summary (Last 24 hours) at 07/31/2019 1509 Last data filed at 07/31/2019 1330 Gross per 24 hour  Intake 240 ml  Output 3000 ml  Net -2760 ml   Filed Weights   07/30/19 0523 07/30/19 2136 07/31/19 0532  Weight: 128.8 kg 130.1 kg 131 kg    ROS: Review of Systems  Constitutional: Negative for fever.  Eyes: Negative for blurred vision.  Respiratory: Positive for shortness of breath. Negative for cough.   Cardiovascular: Negative for chest pain.  Gastrointestinal: Negative for abdominal pain, diarrhea, nausea and vomiting.  Genitourinary: Negative for dysuria.  Musculoskeletal: Negative for joint pain.  Neurological: Negative for dizziness.   Exam: Physical Exam  Constitutional: He is oriented to person, place, and time.  HENT:  Nose: No mucosal edema.  Mouth/Throat: No oropharyngeal exudate or posterior oropharyngeal edema.  Eyes: Pupils are equal, round, and reactive to light. Conjunctivae and lids are normal.  Neck: Carotid bruit is not present.  Cardiovascular: S1 normal and S2 normal. Exam reveals no gallop.  No murmur heard. Pulses:      Dorsalis pedis pulses are 2+ on the left side.  Respiratory: No respiratory distress. He has decreased breath sounds in the right lower field and the left lower field. He has no wheezes. He has no rhonchi. He has rales in the right lower field and the left lower field.  GI: Soft. Bowel sounds are normal. There is  no abdominal tenderness.  Musculoskeletal:     Right ankle: Swelling present.     Left ankle: Swelling present.  Lymphadenopathy:    He has no cervical adenopathy.  Neurological: He is alert and oriented to person, place, and time. No cranial nerve deficit.  Skin: Skin is warm. No rash noted. Nails show no clubbing.  Psychiatric: He has a normal mood and affect.      Data Reviewed: Basic Metabolic Panel: Recent Labs  Lab 07/30/19 0521 07/31/19 0454  NA 142 141  K 5.1 3.5  CL 111 105  CO2 25 29  GLUCOSE 150* 123*  BUN 19 20  CREATININE 1.26* 1.36*  CALCIUM 9.3 9.0   Liver Function Tests: Recent Labs  Lab 07/30/19 0521  AST 67*  ALT 57*  ALKPHOS 54  BILITOT 1.3*  PROT 7.4  ALBUMIN 4.0   CBC: Recent Labs  Lab 07/30/19 0521  WBC 7.3  NEUTROABS 4.8  HGB 15.1  HCT 46.9  MCV 88.8  PLT 193   BNP (last 3 results) Recent Labs    07/30/19 0521  BNP 614.0*      Recent Results (from the past 240 hour(s))  SARS Coronavirus 2 by RT PCR (hospital order, [facility]) Nasopharyngeal Nasopharyngeal Swab     Status: None   Collection Time: 07/30/19  8:37 AM   Specimen: Nasopharyngeal Swab  Result Value Ref Range Status   SARS Coronavirus 2 NEGATIVE NEGATIVE Final  Comment: (NOTE) SARS-CoV-2 target nucleic acids are NOT DETECTED. The SARS-CoV-2 RNA is generally detectable in upper and lower respiratory specimens during the acute phase of infection. The lowest concentration of SARS-CoV-2 viral copies this assay can detect is 250 copies / mL. A negative result does not preclude SARS-CoV-2 infection and should not be used as the sole basis for treatment or other patient management decisions.  A negative result may occur with improper specimen collection / handling, submission of specimen other than nasopharyngeal swab, presence of viral mutation(s) within the areas targeted by this assay, and inadequate number of viral copies (<250  copies / mL). A negative result must be combined with clinical observations, patient history, and epidemiological information. Fact Sheet for Patients:   StrictlyIdeas.no Fact Sheet for Healthcare Providers: BankingDealers.co.za This test is not yet approved or cleared  by the [place] FDA and has been authorized for detection and/or diagnosis of SARS-CoV-2 by FDA under an Emergency Use Authorization (EUA).  This EUA will remain in effect (meaning this test can be used) for the duration of the COVID-19 declaration under Section 564(b)(1) of the Act, 21 U.S.C. section 360bbb-3(b)(1), unless the authorization is terminated or revoked sooner. Performed at [facility], [address] [place], [place] [address]      Studies: [facility] Chest [place] 1 View  Result Date: 07/30/2019 CLINICAL DATA:  Dyspnea EXAM: PORTABLE CHEST 1 VIEW COMPARISON:  11/16/2016 FINDINGS: Low volume chest which accentuates the mildly enlarged heart size. Interstitial coarsening on both sides. No visible effusion or pneumothorax. IMPRESSION: CHF pattern Electronically Signed   By: Monte Fantasia M.D.   On: 07/30/2019 05:21   ECHOCARDIOGRAM COMPLETE  Result Date: 07/31/2019    ECHOCARDIOGRAM REPORT   Patient Name:   Albert Johnson Date of Exam: 07/31/2019 Medical Rec #:  [id number]      Height:       71.0 in Accession #:    [id number]     Weight:       288.9 lb Date of Birth:  [birth year]      BSA:          2.465 m Patient Age:    33 years       BP:           176/78 mmHg Patient Gender: M              HR:           71 bpm. Exam Location:  [facility] Procedure: 2D Echo, Cardiac Doppler and Color Doppler Indications:     CHF 428.0  History:         Patient has no prior history of Echocardiogram examinations.                  CHF, COPD; Risk Factors:Hypertension.  Sonographer:     Sherrie Sport RDCS (AE) Referring Phys:  [id number] Collier Bullock Diagnosing Phys: Ida Rogue MD  IMPRESSIONS  1. Left ventricular ejection fraction, by estimation, is 60 to 65%. The left ventricle has normal function. The left ventricle has no regional wall motion abnormalities. There is mild left ventricular hypertrophy. Left ventricular diastolic parameters are consistent with Grade I diastolic dysfunction (impaired relaxation).  2. Right ventricular systolic function is normal. The right ventricular size is normal. There is normal pulmonary artery systolic pressure. FINDINGS  Left Ventricle: Left ventricular ejection fraction, by estimation, is 60 to 65%. The left ventricle has normal function. The left ventricle has no regional wall motion abnormalities. The  left ventricular internal cavity size was normal in size. There is  mild left ventricular hypertrophy. Left ventricular diastolic parameters are consistent with Grade I diastolic dysfunction (impaired relaxation). Right Ventricle: The right ventricular size is normal. No increase in right ventricular wall thickness. Right ventricular systolic function is normal. There is normal pulmonary artery systolic pressure. The tricuspid regurgitant velocity is 1.85 m/s, and  with an assumed right atrial pressure of 10 mmHg, the estimated right ventricular systolic pressure is 23.7 mmHg. Left Atrium: Left atrial size was normal in size. Right Atrium: Right atrial size was normal in size. Pericardium: A small pericardial effusion is present. Mitral Valve: The mitral valve is normal in structure. Normal mobility of the mitral valve leaflets. No evidence of mitral valve regurgitation. No evidence of mitral valve stenosis. Tricuspid Valve: The tricuspid valve is normal in structure. Tricuspid valve regurgitation is not demonstrated. No evidence of tricuspid stenosis. Aortic Valve: The aortic valve is normal in structure. Aortic valve regurgitation is not visualized. No aortic stenosis is present. Aortic valve mean gradient measures 5.0 mmHg. Aortic valve peak gradient  measures 8.4 mmHg. Aortic valve area, by VTI measures 1.80 cm. Pulmonic Valve: The pulmonic valve was normal in structure. Pulmonic valve regurgitation is not visualized. No evidence of pulmonic stenosis. Aorta: The aortic root is normal in size and structure. Venous: The inferior vena cava is normal in size with greater than 50% respiratory variability, suggesting right atrial pressure of 3 mmHg. IAS/Shunts: No atrial level shunt detected by color flow Doppler.  LEFT VENTRICLE PLAX 2D LVIDd:         5.15 cm  Diastology LVIDs:         3.03 cm  LV e' lateral:   5.77 cm/s LV PW:         1.24 cm  LV E/e' lateral: 13.2 LV IVS:        1.08 cm  LV e' medial:    5.55 cm/s LVOT diam:     2.00 cm  LV E/e' medial:  13.7 LV SV:         49 LV SV Index:   20 LVOT Area:     3.14 cm  RIGHT VENTRICLE RV Basal diam:  4.42 cm RV S prime:     16.40 cm/s LEFT ATRIUM             Index       RIGHT ATRIUM           Index LA diam:        3.70 cm 1.50 cm/m  RA Area:     20.00 cm LA Vol (A2C):   56.9 ml 23.08 ml/m RA Volume:   52.00 ml  21.09 ml/m LA Vol (A4C):   41.4 ml 16.79 ml/m LA Biplane Vol: 49.7 ml 20.16 ml/m  AORTIC VALVE                    PULMONIC VALVE AV Area (Vmax):    1.80 cm     RVOT Peak grad: 3 mmHg AV Area (Vmean):   1.82 cm AV Area (VTI):     1.80 cm AV Vmax:           144.67 cm/s AV Vmean:          101.633 cm/s AV VTI:            0.273 m AV Peak Grad:      8.4 mmHg AV Mean Grad:  5.0 mmHg LVOT Vmax:         82.80 cm/s LVOT Vmean:        58.900 cm/s LVOT VTI:          0.157 m LVOT/AV VTI ratio: 0.57  AORTA Ao Root diam: 3.00 cm MITRAL VALVE               TRICUSPID VALVE MV Area (PHT): 3.37 cm    TR Peak grad:   13.7 mmHg MV Decel Time: 225 msec    TR Vmax:        185.00 cm/s MV E velocity: 76.20 cm/s MV A velocity: 72.30 cm/s  SHUNTS MV E/A ratio:  1.05        Systemic VTI:  0.16 m                            Systemic Diam: 2.00 cm Julien Nordmann MD Electronically signed by Julien Nordmann MD Signature  Date/Time: 07/31/2019/2:55:27 PM    Final    US Abdomen Limited RUQ  Result Date: 07/30/2019 CLINICAL DATA:  Transaminitis. EXAM: ULTRASOUND ABDOMEN LIMITED RIGHT UPPER QUADRANT COMPARISON:  September 12, 2013. FINDINGS: Gallbladder: No gallstones or wall thickening visualized. No sonographic Murphy sign noted by sonographer. Common bile duct: Diameter: 3 mm which is within normal limits. Liver: No focal lesion identified. Mildly increased echogenicity of hepatic parenchyma is noted suggesting hepatic steatosis or other diffuse hepatocellular disease. Portal vein is patent on color Doppler imaging with normal direction of blood flow towards the liver. Other: None. IMPRESSION: Mildly increased echogenicity of hepatic parenchyma is noted suggesting hepatic steatosis or other diffuse hepatocellular disease. No other abnormality seen in the right upper quadrant of the abdomen. Electronically Signed   By: Lupita Raider M.D.   On: 07/30/2019 11:37    Scheduled Meds: . atorvastatin  80 mg Oral QHS  . cholecalciferol  1,000 Units Oral Daily  . DULoxetine  60 mg Oral Daily  . enoxaparin (LOVENOX) injection  40 mg Subcutaneous Q24H  . furosemide  40 mg Intravenous BID  . isosorbide mononitrate  30 mg Oral Once  . [START ON 08/01/2019] isosorbide mononitrate  60 mg Oral Daily  . [START ON 08/01/2019] losartan  100 mg Oral Daily  . losartan  50 mg Oral Once  . metoprolol tartrate  50 mg Oral BID  . mometasone-formoterol  2 puff Inhalation BID  . nicotine  7 mg Transdermal Daily  . pantoprazole  40 mg Oral Daily  . sodium chloride flush  10-40 mL Intracatheter Q12H  . sodium chloride flush  3 mL Intravenous Q12H   Continuous Infusions: . sodium chloride      Assessment/Plan:  1. Acute diastolic congestive heart failure.  Continue diuresis today with IV Lasix 40 mg IV twice daily.  Patient already on metoprolol.  Continue daily weights and monitoring urine output. 2. Acute hypoxic respiratory failure.   Patient required BiPAP initially.  Patient was on nasal cannula this morning and tapered off to room air this afternoon. 3. Hypertension blood pressure slightly elevated.  Add low-dose hydralazine orally.  Continue metoprolol, losartan, Lasix. 4. Hyperlipidemia on Lipitor 5. GERD on Protonix 6. Weakness we will get physical therapy evaluation 7. Sleep apnea wears CPAP at home.  Patient states that he has not been able to clean his tubing.  We will have transitional care team look into this. 8. COPD.  Respiratory status stable  Code Status:  Code Status Orders  (From admission, onward)         Start     Ordered   07/30/19 0859  Full code  Continuous     07/30/19 0914        Code Status History    This patient has a current code status but no historical code status.   Advance Care Planning Activity     Family Communication: Spoke in front of family that came into the room while I was already in the room with the patient Disposition Plan: Status is: Inpatient  Dispo: The patient is from: Home              Anticipated d/c is to: Home              Anticipated d/c date is: 08/01/2019              Patient currently requires inpatient hospital stay for IV diuresis.  We assess patient's volume status tomorrow.  Potential discharge home tomorrow versus the following day depending on diuresis and kidney function.  Consultants:  Cardiology  Time spent: 28 minutes  Conley [employer]

## 2019-08-01 DIAGNOSIS — F17209 Nicotine dependence, unspecified, with unspecified nicotine-induced disorders: Secondary | ICD-10-CM

## 2019-08-01 DIAGNOSIS — K760 Fatty (change of) liver, not elsewhere classified: Secondary | ICD-10-CM

## 2019-08-01 DIAGNOSIS — G4733 Obstructive sleep apnea (adult) (pediatric): Secondary | ICD-10-CM

## 2019-08-01 LAB — BASIC METABOLIC PANEL
Anion gap: 6 (ref 5–15)
BUN: 22 mg/dL (ref 8–23)
CO2: 33 mmol/L — ABNORMAL HIGH (ref 22–32)
Calcium: 9.3 mg/dL (ref 8.9–10.3)
Chloride: 102 mmol/L (ref 98–111)
Creatinine, Ser: 1.34 mg/dL — ABNORMAL HIGH (ref 0.61–1.24)
GFR calc Af Amer: >60 mL/min (ref >60)
GFR calc non Af Amer: 55 mL/min — ABNORMAL LOW (ref >60)
Glucose, Bld: 126 mg/dL — ABNORMAL HIGH (ref 70–99)
Potassium: 3.8 mmol/L (ref 3.5–5.1)
Sodium: 141 mmol/L (ref 135–145)

## 2019-08-01 MED ORDER — ISOSORBIDE MONONITRATE ER 60 MG PO TB24: 60.0000 mg | ORAL_TABLET | Freq: Every day | ORAL | 0 refills | Status: DC

## 2019-08-01 MED ORDER — NICOTINE 7 MG/24HR TD PT24: MEDICATED_PATCH | TRANSDERMAL | 0 refills | Status: DC

## 2019-08-01 MED ORDER — METOPROLOL TARTRATE 25 MG PO TABS: 25.0000 mg | ORAL_TABLET | Freq: Two times a day (BID) | ORAL | Status: DC

## 2019-08-01 MED ORDER — DULOXETINE HCL 60 MG PO CPEP: 60.0000 mg | ORAL_CAPSULE | Freq: Every day | ORAL | Status: DC

## 2019-08-01 MED ORDER — LOSARTAN POTASSIUM 100 MG PO TABS: 100.0000 mg | ORAL_TABLET | Freq: Every day | ORAL | 0 refills | Status: AC

## 2019-08-01 MED ORDER — FUROSEMIDE 40 MG PO TABS: 40.0000 mg | ORAL_TABLET | Freq: Every day | ORAL | Status: DC

## 2019-08-01 MED ORDER — ATORVASTATIN CALCIUM 80 MG PO TABS: 80.0000 mg | ORAL_TABLET | Freq: Every day | ORAL | 0 refills | Status: DC

## 2019-08-01 MED ORDER — HYDRALAZINE HCL 10 MG PO TABS: 10.0000 mg | ORAL_TABLET | Freq: Three times a day (TID) | ORAL | 0 refills | Status: DC

## 2019-08-01 MED ORDER — METOPROLOL TARTRATE 25 MG PO TABS: 25.0000 mg | ORAL_TABLET | Freq: Two times a day (BID) | ORAL | 0 refills | Status: DC

## 2019-08-01 MED ORDER — FUROSEMIDE 40 MG PO TABS: 40.0000 mg | ORAL_TABLET | Freq: Every day | ORAL | 0 refills | Status: DC

## 2019-08-01 NOTE — Evaluation (Signed)
Physical Therapy Evaluation Patient Details Name: Albert Johnson MRN: [id number] DOB: November 03, 1950 Today's Date: 08/01/2019   History of Present Illness  Pt admitted for acute CHF with complaints of SOB symptoms and weakness. History includes HTN, COPD, CHF, and HLD.  Clinical Impression  Pt is a pleasant 69 year old male who was admitted for acute CHF. Pt performs bed mobility/transfers with independence and ambulation with supervision and no AD. Slow gait speed noted. Pt demonstrates deficits with strength/balance. Would benefit from skilled PT to address above deficits and promote optimal return to PLOF. Recommending OP PT at this time.    Follow Up Recommendations Outpatient PT    Equipment Recommendations  None recommended by PT    Recommendations for Other Services       Precautions / Restrictions Precautions Precautions: Fall Restrictions Weight Bearing Restrictions: No      Mobility  Bed Mobility Overal bed mobility: Independent             General bed mobility comments: safe technique with ease of transition  Transfers Overall transfer level: Independent Equipment used: None             General transfer comment: safe technique with ease of transition  Ambulation/Gait Ambulation/Gait assistance: Supervision Gait Distance (Feet): 300 Feet Assistive device: None Gait Pattern/deviations: Step-through pattern     General Gait Details: forward flexed posture with no AD needed. SLighty slow speed, however no overt LOB noted. Mild unsteadiness. With exertion, O2 sats at 92% and HR WNL.  Stairs Stairs: Yes Stairs assistance: Supervision Stair Management: One rail Right;Alternating pattern;Forwards Number of Stairs: 8 General stair comments: practiced 4 steps x 2 with safe technique in order to safely enter home.  Wheelchair Mobility    Modified Rankin (Stroke Patients Only)       Balance Overall balance assessment: Mild deficits observed, not  formally tested                                           Pertinent Vitals/Pain Pain Assessment: No/denies pain    Home Living Family/patient expects to be discharged to:: Private residence Living Arrangements: Spouse/significant other(fiance) Available Help at Discharge: Available PRN/intermittently Type of Home: House Home Access: Stairs to enter Entrance Stairs-Rails: Can reach both Entrance Stairs-Number of Steps: 4 Home Layout: One level Home Equipment: Cane - single point      Prior Function Level of Independence: Independent with assistive device(s)         Comments: uses SPC on days when he feels weak, however most days he is independent with all mobility ADLS     Hand Dominance        Extremity/Trunk Assessment   Upper Extremity Assessment Upper Extremity Assessment: Overall WFL for tasks assessed    Lower Extremity Assessment Lower Extremity Assessment: Generalized weakness(B LE groslsy 4+/5)       Communication   Communication: No difficulties  Cognition Arousal/Alertness: Awake/alert Behavior During Therapy: WFL for tasks assessed/performed Overall Cognitive Status: Within Functional Limits for tasks assessed                                        General Comments      Exercises Other Exercises Other Exercises: ambulated to bathroom with independence. Able to perform hygiene safely   Assessment/Plan  PT Assessment Patient needs continued PT services  PT Problem List Decreased strength;Decreased balance;Decreased mobility       PT Treatment Interventions Gait training;Stair training;Therapeutic exercise;Balance training    PT Goals (Current goals can be found in the Care Plan section)  Acute Rehab PT Goals Patient Stated Goal: to go home PT Goal Formulation: With patient Time For Goal Achievement: 08/15/19 Potential to Achieve Goals: Good    Frequency Min 2X/week   Barriers to discharge         Co-evaluation               AM-PAC PT "6 Clicks" Mobility  Outcome Measure Help needed turning from your back to your side while in a flat bed without using bedrails?: None Help needed moving from lying on your back to sitting on the side of a flat bed without using bedrails?: None Help needed moving to and from a bed to a chair (including a wheelchair)?: None Help needed standing up from a chair using your arms (e.g., wheelchair or bedside chair)?: None Help needed to walk in hospital room?: None Help needed climbing 3-5 steps with a railing? : None 6 Click Score: 24    End of Session Equipment Utilized During Treatment: Gait belt Activity Tolerance: Patient tolerated treatment well Patient left: in chair Nurse Communication: Mobility status PT Visit Diagnosis: Unsteadiness on feet (R26.81);Muscle weakness (generalized) (M62.81)    Time: 9147-8295 PT Time Calculation (min) (ACUTE ONLY): 16 min   Charges:   PT Evaluation $PT Eval Low Complexity: 1 Low PT Treatments $Gait Training: 8-22 mins        Greggory Stallion, PT, DPT [phone]   Chen Saadeh 08/01/2019, 11:55 AM

## 2019-08-01 NOTE — Progress Notes (Addendum)
Progress Note  Patient Name: Albert Johnson Date of Encounter: 08/01/2019  Primary Cardiologist: New to [facility] - Dr. Garen Lah  Subjective   No CP, racing HR, or palpitations reported. States he still feels DOE with even minimal movement but overall feels improvement in volume status.  Also reports improvement in mental status from right before his admission.  He reports that his facial, hand, and leg edema have significantly improved with IV diuresis.    He reports that he wants to be adequately diuresed before heading home, as he does not want to head home too soon.  We discussed his diet and he will attempt to avoid canned foods from this point forward.  He is agreeable to follow-up with [facility] cardiology.  Inpatient Medications    Scheduled Meds: . atorvastatin  80 mg Oral QHS  . cholecalciferol  1,000 Units Oral Daily  . DULoxetine  60 mg Oral Daily  . enoxaparin (LOVENOX) injection  40 mg Subcutaneous Q24H  . furosemide  40 mg Intravenous BID  . hydrALAZINE  10 mg Oral Q8H  . isosorbide mononitrate  60 mg Oral Daily  . losartan  100 mg Oral Daily  . metoprolol tartrate  50 mg Oral BID  . mometasone-formoterol  2 puff Inhalation BID  . nicotine  7 mg Transdermal Daily  . pantoprazole  40 mg Oral Daily  . sodium chloride flush  10-40 mL Intracatheter Q12H  . sodium chloride flush  3 mL Intravenous Q12H   Continuous Infusions: . sodium chloride     PRN Meds: sodium chloride, acetaminophen, albuterol, benzonatate, nitroGLYCERIN, ondansetron (ZOFRAN) IV, oxyCODONE-acetaminophen, sodium chloride flush, sodium chloride flush   Vital Signs    Vitals:   07/31/19 1536 07/31/19 1947 08/01/19 0358 08/01/19 0717  BP: (!) 164/79 (!) 164/81 (!) 159/92 (!) 145/82  Pulse: (!) 58 64 61 (!) 52  Resp: 19 20 20 17   Temp:  98.2 F (36.8 C) 98 F (36.7 C) 98.2 F (36.8 C)  TempSrc:  Oral Oral Oral  SpO2: 96% 96% 100% 97%  Weight:   127.5 kg   Height:        Intake/Output Summary  (Last 24 hours) at 08/01/2019 0917 Last data filed at 08/01/2019 0405 Gross per 24 hour  Intake 480 ml  Output 5650 ml  Net -5170 ml   Last 3 Weights 08/01/2019 07/31/2019 07/30/2019  Weight (lbs) 281 lb 288 lb 14.4 oz 286 lb 12.8 oz  Weight (kg) 127.461 kg 131.044 kg 130.092 kg      Telemetry    Sinus bradycardia with rates 49 to 51 bpm, 3 beat NSVT at approximately 6 AM (asx) - Personally Reviewed  ECG    No new tracings - Personally Reviewed  Physical Exam   GEN: No acute distress.  Lying in bed.  Mask in place. Neck:  JVP ~9cm Cardiac:  Bradycardic but regular, no murmurs, rubs, or gallops.  Respiratory:  Bilaterally diminished breath sounds.  No longer on nasal cannula oxygen. GI: Soft, nontender, non-distended  MS: No bilateral pitting edema; No deformity. Neuro:  Nonfocal   Psych: Normal affect   Labs    High Sensitivity Troponin:   Recent Labs  Lab 07/30/19 0521 07/30/19 0837 07/30/19 1547  TROPONINIHS 22* 37* 44*      Chemistry Recent Labs  Lab 07/30/19 0521 07/31/19 0454 08/01/19 0419  NA 142 141 141  K 5.1 3.5 3.8  CL 111 105 102  CO2 25 29 33*  GLUCOSE 150* 123* 126*  BUN 19 20 22   CREATININE 1.26* 1.36* 1.34*  CALCIUM 9.3 9.0 9.3  PROT 7.4  --   --   ALBUMIN 4.0  --   --   AST 67*  --   --   ALT 57*  --   --   ALKPHOS 54  --   --   BILITOT 1.3*  --   --   GFRNONAA 59* 54* 55*  GFRAA >60 >60 >60  ANIONGAP 6 7 6      Hematology Recent Labs  Lab 07/30/19 0521  WBC 7.3  RBC 5.28  HGB 15.1  HCT 46.9  MCV 88.8  MCH 28.6  MCHC 32.2  RDW 13.7  PLT 193    BNP Recent Labs  Lab 07/30/19 0521  BNP 614.0*     DDimer No results for input(s): DDIMER in the last 168 hours.   Radiology    ECHOCARDIOGRAM COMPLETE  Result Date: 07/31/2019    ECHOCARDIOGRAM REPORT   Patient Name:   Albert Johnson Date of Exam: 07/31/2019 Medical Rec #:  Albert Johnson      Height:       71.0 in Accession #:    08/02/2019     Weight:       288.9 lb Date of  Birth:  Aug 29, 1950      BSA:          2.465 m Patient Age:    68 years       BP:           176/78 mmHg Patient Gender: M              HR:           71 bpm. Exam Location:  [facility] Procedure: 2D Echo, Cardiac Doppler and Color Doppler Indications:     CHF 428.0  History:         Patient has no prior history of Echocardiogram examinations.                  CHF, COPD; Risk Factors:Hypertension.  Sonographer:     [id number] RDCS (AE) Referring Phys:  09/04/1950 Albert Johnson Diagnosing Phys: [id number] MD IMPRESSIONS  1. Left ventricular ejection fraction, by estimation, is 60 to 65%. The left ventricle has normal function. The left ventricle has no regional wall motion abnormalities. There is mild left ventricular hypertrophy. Left ventricular diastolic parameters are consistent with Grade I diastolic dysfunction (impaired relaxation).  2. Right ventricular systolic function is normal. The right ventricular size is normal. There is normal pulmonary artery systolic pressure. FINDINGS  Left Ventricle: Left ventricular ejection fraction, by estimation, is 60 to 65%. The left ventricle has normal function. The left ventricle has no regional wall motion abnormalities. The left ventricular internal cavity size was normal in size. There is  mild left ventricular hypertrophy. Left ventricular diastolic parameters are consistent with Grade I diastolic dysfunction (impaired relaxation). Right Ventricle: The right ventricular size is normal. No increase in right ventricular wall thickness. Right ventricular systolic function is normal. There is normal pulmonary artery systolic pressure. The tricuspid regurgitant velocity is 1.85 m/s, and  with an assumed right atrial pressure of 10 mmHg, the estimated right ventricular systolic pressure is 23.7 mmHg. Left Atrium: Left atrial size was normal in size. Right Atrium: Right atrial size was normal in size. Pericardium: A small pericardial effusion is present. Mitral Valve: The mitral  valve is normal in structure. Normal mobility of the mitral valve leaflets. No evidence of  mitral valve regurgitation. No evidence of mitral valve stenosis. Tricuspid Valve: The tricuspid valve is normal in structure. Tricuspid valve regurgitation is not demonstrated. No evidence of tricuspid stenosis. Aortic Valve: The aortic valve is normal in structure. Aortic valve regurgitation is not visualized. No aortic stenosis is present. Aortic valve mean gradient measures 5.0 mmHg. Aortic valve peak gradient measures 8.4 mmHg. Aortic valve area, by VTI measures 1.80 cm. Pulmonic Valve: The pulmonic valve was normal in structure. Pulmonic valve regurgitation is not visualized. No evidence of pulmonic stenosis. Aorta: The aortic root is normal in size and structure. Venous: The inferior vena cava is normal in size with greater than 50% respiratory variability, suggesting right atrial pressure of 3 mmHg. IAS/Shunts: No atrial level shunt detected by color flow Doppler.  LEFT VENTRICLE PLAX 2D LVIDd:         5.15 cm  Diastology LVIDs:         3.03 cm  LV e' lateral:   5.77 cm/s LV PW:         1.24 cm  LV E/e' lateral: 13.2 LV IVS:        1.08 cm  LV e' medial:    5.55 cm/s LVOT diam:     2.00 cm  LV E/e' medial:  13.7 LV SV:         49 LV SV Index:   20 LVOT Area:     3.14 cm  RIGHT VENTRICLE RV Basal diam:  4.42 cm RV S prime:     16.40 cm/s LEFT ATRIUM             Index       RIGHT ATRIUM           Index LA diam:        3.70 cm 1.50 cm/m  RA Area:     20.00 cm LA Vol (A2C):   56.9 ml 23.08 ml/m RA Volume:   52.00 ml  21.09 ml/m LA Vol (A4C):   41.4 ml 16.79 ml/m LA Biplane Vol: 49.7 ml 20.16 ml/m  AORTIC VALVE                    PULMONIC VALVE AV Area (Vmax):    1.80 cm     RVOT Peak grad: 3 mmHg AV Area (Vmean):   1.82 cm AV Area (VTI):     1.80 cm AV Vmax:           144.67 cm/s AV Vmean:          101.633 cm/s AV VTI:            0.273 m AV Peak Grad:      8.4 mmHg AV Mean Grad:      5.0 mmHg LVOT Vmax:          82.80 cm/s LVOT Vmean:        58.900 cm/s LVOT VTI:          0.157 m LVOT/AV VTI ratio: 0.57  AORTA Ao Root diam: 3.00 cm MITRAL VALVE               TRICUSPID VALVE MV Area (PHT): 3.37 cm    TR Peak grad:   13.7 mmHg MV Decel Time: 225 msec    TR Vmax:        185.00 cm/s MV E velocity: 76.20 cm/s MV A velocity: 72.30 cm/s  SHUNTS MV E/A ratio:  1.05        Systemic VTI:  0.16 m                            Systemic Diam: 2.00 cm Julien Nordmann MD Electronically signed by Julien Nordmann MD Signature Date/Time: 07/31/2019/2:55:27 PM    Final    US Abdomen Limited RUQ  Result Date: 07/30/2019 CLINICAL DATA:  Transaminitis. EXAM: ULTRASOUND ABDOMEN LIMITED RIGHT UPPER QUADRANT COMPARISON:  September 12, 2013. FINDINGS: Gallbladder: No gallstones or wall thickening visualized. No sonographic Murphy sign noted by sonographer. Common bile duct: Diameter: 3 mm which is within normal limits. Liver: No focal lesion identified. Mildly increased echogenicity of hepatic parenchyma is noted suggesting hepatic steatosis or other diffuse hepatocellular disease. Portal vein is patent on color Doppler imaging with normal direction of blood flow towards the liver. Other: None. IMPRESSION: Mildly increased echogenicity of hepatic parenchyma is noted suggesting hepatic steatosis or other diffuse hepatocellular disease. No other abnormality seen in the right upper quadrant of the abdomen. Electronically Signed   By: Lupita Raider M.D.   On: 07/30/2019 11:37    Cardiac Studies   Echo 07/31/2019 1. Left ventricular ejection fraction, by estimation, is 60 to 65%. The  left ventricle has normal function. The left ventricle has no regional  wall motion abnormalities. There is mild left ventricular hypertrophy.  Left ventricular diastolic parameters.  are consistent with Grade I diastolic dysfunction (impaired relaxation).  2. Right ventricular systolic function is normal. The right ventricular  size is normal. There is normal  pulmonary artery systolic pressure.   Patient Profile     69 y.o. male with history of HFpEF, hypertension, hyperlipidemia, COPD, ongoing tobacco use x50 years, and OSA on CPAP who we are seeing for dyspnea.  Assessment & Plan    Acute on chronic HFpEF --Improved shortness of breath and dyspnea on exertion.  Volume status significantly improved from yesterday.  He has been weaned off BiPAP and supplemental oxygen. --Net -5 L yesterday and -535 cc for today.  Net -9.9 L for the admission.  --He will likely benefit from at least 1 more dose of IV lasix before transition to oral diuresis given his continued significant output.   --Continue to monitor I's/O's, daily standing weights. Daily BMET to monitor renal function. Continue to replete electrolytes with K goal 4.0, Mg goal 2.0. --Echo as above with EF 60 to 65%, mild LVH.  --Likely hypertensive heart disease. Optimal BP control.  He reportedly eats a lot of canned foods, including frequent soups.  Discussed limiting canned foods and monitoring his salt closely. --Will need discharged home with an oral diuretic.  Continue current losartan with close monitoring of renal renal function at discharge.  He will need follow-up scheduled in the office.  Elevated high-sensitivity troponin --No chest pain.  Mild elevation of high-sensitivity troponin not consistent with ACS and likely due to supply demand ischemia in the setting of volume overload and elevated pressure.  No indication for heparin at this time.  Echo as above with EF normal.  No indication for further ischemic work-up at this time. --He will need to avoid nitrates at the same time as sildenafil.   Hypertension -BP improved from yesterday.  Continue metoprolol and losartan, as well as Imdur.  As above, will likely need discharge on a standing diuretic.  OSA --Continue CPAP.  Tobacco use --Complete cessation advised.  AKI -Current renal function stable with start of Losartan.  Close monitoring of renal function with losartan and diuresis.  Hypokalemia --Improving. Continue to replete K with goal 4.0.  For questions or updates, please contact [facility] Please consult [email or web address] for contact info under        Signed, Lennon AlstromJacquelyn D Kule Gascoigne, PA-C  08/01/2019, 9:17 AM

## 2019-08-01 NOTE — Discharge Summary (Signed)
[facility] NAME: Albert Johnson    MR#:  [id number]  DATE OF BIRTH:  1951-03-12  DATE OF ADMISSION:  07/30/2019 ADMITTING PHYSICIAN: Collier Bullock, MD  DATE OF DISCHARGE: 08/01/2019  1:37 PM  PRIMARY CARE PHYSICIAN: Rogelia Rohrer, MD    ADMISSION DIAGNOSIS:  Transaminitis [R74.01] Acute CHF (congestive heart failure) ([place]) [I50.9]  DISCHARGE DIAGNOSIS:  Principal Problem:   Acute diastolic CHF (congestive heart failure) (HCC) Active Problems:   COPD (chronic obstructive pulmonary disease) (HCC)   Nicotine dependence   Acute respiratory failure with hypoxia (HCC)   Hyperlipidemia   Gastroesophageal reflux disease without esophagitis   Weakness   Hypertensive heart disease with heart failure ([place])   SECONDARY DIAGNOSIS:   Past Medical History:  Diagnosis Date  . Arthritis   . CHF (congestive heart failure) ([place])   . COPD (chronic obstructive pulmonary disease) ([place])   . Hyperlipidemia   . Hypertension   . Renal disorder     HOSPITAL COURSE:   1.  Acute diastolic congestive heart failure.  The patient was diuresed in the hospital with IV Lasix 40 mg IV twice a day.  The patient is on metoprolol already.  Since the patient does not take Lasix at home we will switch over to Lasix 40 mg daily orally at home.  Low-salt diet discussed at length with the patient. 2.  Acute hypoxic respiratory failure.  The patient initially required BiPAP.  The patient was on nasal cannula and tapered off oxygen altogether and is saturating well on room air. 3.  Essential hypertension.  Blood pressure elevated during the hospital course but has come down with adding and titrating medications.  Patient is on metoprolol 25 mg twice a day hydralazine 10 mg every 8 hours, Imdur and losartan. 4.  Hyperlipidemia unspecified on Lipitor 5.  Sleep apnea.  Wears CPAP at home.  Transitional care team to contact his company about his tubing and whether that  can be changed. 6.  COPD.  Respiratory status stable. 7.  Patient is advised he cannot take Viagra because he is on Imdur. 8.  Elevated liver function test and hepatic steatosis seen on ultrasound abdomen.  DISCHARGE CONDITIONS:   Satisfactory  CONSULTS OBTAINED:  Treatment Team:  Kate Sable, MD  DRUG ALLERGIES:  No Known Allergies  DISCHARGE MEDICATIONS:   Allergies as of 08/01/2019   No Known Allergies     Medication List    STOP taking these medications   metoprolol succinate 100 MG 24 hr tablet Commonly known as: TOPROL-XL   Narcan 4 MG/0.1ML Liqd nasal spray kit Generic drug: naloxone   sildenafil 100 MG tablet Commonly known as: VIAGRA     TAKE these medications   albuterol 108 (90 Base) MCG/ACT inhaler Commonly known as: VENTOLIN HFA Inhale 2 puffs into the lungs every 6 (six) hours as needed for wheezing.   aspirin 81 MG EC tablet Take by mouth.   atorvastatin 80 MG tablet Commonly known as: LIPITOR Take 1 tablet (80 mg total) by mouth at bedtime. What changed: Another medication with the same name was removed. Continue taking this medication, and follow the directions you see here.   DULoxetine 60 MG capsule Commonly known as: Cymbalta Take 1 capsule (60 mg total) by mouth daily.   furosemide 40 MG tablet Commonly known as: LASIX Take 1 tablet (40 mg total) by mouth daily. Start taking on: Aug 02, 2019   hydrALAZINE 10 MG tablet Commonly  known as: APRESOLINE Take 1 tablet (10 mg total) by mouth every 8 (eight) hours.   isosorbide mononitrate 60 MG 24 hr tablet Commonly known as: IMDUR Take 1 tablet (60 mg total) by mouth daily. Start taking on: Aug 02, 2019 What changed:  medication strength how much to take   losartan 100 MG tablet Commonly known as: COZAAR Take 1 tablet (100 mg total) by mouth daily. Start taking on: Aug 02, 2019 What changed:  medication strength how much to take   metoprolol tartrate 25 MG  tablet Commonly known as: LOPRESSOR Take 1 tablet (25 mg total) by mouth 2 (two) times daily. What changed:  medication strength how much to take   nicotine 7 mg/24hr patch Commonly known as: NICODERM CQ - dosed in mg/24 hr '7mg'$  patch chest wall daily (okay to substitute generic)   Nitrostat 0.4 MG SL tablet Generic drug: nitroGLYCERIN Place 0.4 mg under the tongue every 5 (five) minutes as needed.   omeprazole 20 MG capsule Commonly known as: PRILOSEC Take 20 mg by mouth daily.   Symbicort 80-4.5 MCG/ACT inhaler Generic drug: budesonide-formoterol Inhale 2 puffs into the lungs 2 (two) times daily.   Vitamin D-1000 Max St 25 MCG (1000 UT) tablet Generic drug: Cholecalciferol Take 1,000 mg by mouth daily.        DISCHARGE INSTRUCTIONS:   Follow-up PMD 5 days Follow-up cardiology 1 week Follow-up CHF clinic  If you experience worsening of your admission symptoms, develop shortness of breath, life threatening emergency, suicidal or homicidal thoughts you must seek medical attention immediately by calling 911 or calling your MD immediately  if symptoms less severe.  You Must read complete instructions/literature along with all the possible adverse reactions/side effects for all the Medicines you take and that have been prescribed to you. Take any new Medicines after you have completely understood and accept all the possible adverse reactions/side effects.   Please note  You were cared for by a hospitalist during your hospital stay. If you have any questions about your discharge medications or the care you received while you were in the hospital after you are discharged, you can call the unit and asked to speak with the hospitalist on call if the hospitalist that took care of you is not available. Once you are discharged, your primary care physician will handle any further medical issues. Please note that NO REFILLS for any discharge medications will be authorized once you are  discharged, as it is imperative that you return to your primary care physician (or establish a relationship with a primary care physician if you do not have one) for your aftercare needs so that they can reassess your need for medications and monitor your lab values.    Today   CHIEF COMPLAINT:   Chief Complaint  Patient presents with  . Shortness of Breath    HISTORY OF PRESENT ILLNESS:  Albert Johnson  is a 69 y.o. male with a known history of CHF presents with shortness of breath   VITAL SIGNS:  Blood pressure 133/72, pulse (!) 55, temperature 98.2 F (36.8 C), temperature source Oral, resp. rate 15, height '5\' 11"'$  (1.803 m), weight 127.5 kg, SpO2 98 %.  I/O:    Intake/Output Summary (Last 24 hours) at 08/01/2019 1523 Last data filed at 08/01/2019 1150 Gross per 24 hour  Intake 480 ml  Output 4425 ml  Net -3945 ml    PHYSICAL EXAMINATION:  GENERAL:  69 y.o.-year-old patient lying in the bed with no  acute distress.  EYES: Pupils equal, round, reactive to light and accommodation. No scleral icterus. Extraocular muscles intact.  HEENT: Head atraumatic, normocephalic. Oropharynx and nasopharynx clear.  LUNGS: Decreased breath sounds bilateral bases, no wheezing, rales,rhonchi or crepitation. No use of accessory muscles of respiration.  CARDIOVASCULAR: S1, S2 normal. No murmurs, rubs, or gallops.  ABDOMEN: Soft, non-tender, non-distended. Bowel sounds present. No organomegaly or mass.  EXTREMITIES: Trace pedal edema.no cyanosis, or clubbing.  NEUROLOGIC: Cranial nerves II through XII are intact. Muscle strength 5/5 in all extremities. Sensation intact. Gait not checked.  PSYCHIATRIC: The patient is alert and oriented x 3.  SKIN: No obvious rash, lesion, or ulcer.   DATA REVIEW:   CBC Recent Labs  Lab 07/30/19 0521  WBC 7.3  HGB 15.1  HCT 46.9  PLT 193    Chemistries  Recent Labs  Lab 07/30/19 0521 07/31/19 0454 08/01/19 0419  NA 142   < > 141  K 5.1   < > 3.8   CL 111   < > 102  CO2 25   < > 33*  GLUCOSE 150*   < > 126*  BUN 19   < > 22  CREATININE 1.26*   < > 1.34*  CALCIUM 9.3   < > 9.3  AST 67*  --   --   ALT 57*  --   --   ALKPHOS 54  --   --   BILITOT 1.3*  --   --    < > = values in this interval not displayed.    Microbiology Results  Results for orders placed or performed during the hospital encounter of 07/30/19  SARS Coronavirus 2 by RT PCR (hospital order, performed in [facility] hospital lab) Nasopharyngeal Nasopharyngeal Swab     Status: None   Collection Time: 07/30/19  8:37 AM   Specimen: Nasopharyngeal Swab  Result Value Ref Range Status   SARS Coronavirus 2 NEGATIVE NEGATIVE Final    Comment: (NOTE) SARS-CoV-2 target nucleic acids are NOT DETECTED. The SARS-CoV-2 RNA is generally detectable in upper and lower respiratory specimens during the acute phase of infection. The lowest concentration of SARS-CoV-2 viral copies this assay can detect is 250 copies / mL. A negative result does not preclude SARS-CoV-2 infection and should not be used as the sole basis for treatment or other patient management decisions.  A negative result may occur with improper specimen collection / handling, submission of specimen other than nasopharyngeal swab, presence of viral mutation(s) within the areas targeted by this assay, and inadequate number of viral copies (<250 copies / mL). A negative result must be combined with clinical observations, patient history, and epidemiological information. Fact Sheet for Patients:   StrictlyIdeas.no Fact Sheet for Healthcare Providers: BankingDealers.co.za This test is not yet approved or cleared  by the [place] FDA and has been authorized for detection and/or diagnosis of SARS-CoV-2 by FDA under an Emergency Use Authorization (EUA).  This EUA will remain in effect (meaning this test can be used) for the duration of the COVID-19 declaration under  Section 564(b)(1) of the Act, 21 U.S.C. section 360bbb-3(b)(1), unless the authorization is terminated or revoked sooner. Performed at [facility], [address] [place], [place] [address]     RADIOLOGY:  ECHOCARDIOGRAM COMPLETE  Result Date: 07/31/2019    ECHOCARDIOGRAM REPORT   Patient Name:   Albert Johnson Date of Exam: 07/31/2019 Medical Rec #:  [id number]      Height:  71.0 in Accession #:    [id number]     Weight:       288.9 lb Date of Birth:  1950/11/30      BSA:          2.465 m Patient Age:    69 years       BP:           176/78 mmHg Patient Gender: M              HR:           71 bpm. Exam Location:  [facility] Procedure: 2D Echo, Cardiac Doppler and Color Doppler Indications:     CHF 428.0  History:         Patient has no prior history of Echocardiogram examinations.                  CHF, COPD; Risk Factors:Hypertension.  Sonographer:     Sherrie Sport RDCS (AE) Referring Phys:  [id number] Collier Bullock Diagnosing Phys: Ida Rogue MD IMPRESSIONS  1. Left ventricular ejection fraction, by estimation, is 60 to 65%. The left ventricle has normal function. The left ventricle has no regional wall motion abnormalities. There is mild left ventricular hypertrophy. Left ventricular diastolic parameters are consistent with Grade I diastolic dysfunction (impaired relaxation).  2. Right ventricular systolic function is normal. The right ventricular size is normal. There is normal pulmonary artery systolic pressure. FINDINGS  Left Ventricle: Left ventricular ejection fraction, by estimation, is 60 to 65%. The left ventricle has normal function. The left ventricle has no regional wall motion abnormalities. The left ventricular internal cavity size was normal in size. There is  mild left ventricular hypertrophy. Left ventricular diastolic parameters are consistent with Grade I diastolic dysfunction (impaired relaxation). Right Ventricle: The right ventricular size is normal. No increase in right  ventricular wall thickness. Right ventricular systolic function is normal. There is normal pulmonary artery systolic pressure. The tricuspid regurgitant velocity is 1.85 m/s, and  with an assumed right atrial pressure of 10 mmHg, the estimated right ventricular systolic pressure is 94.5 mmHg. Left Atrium: Left atrial size was normal in size. Right Atrium: Right atrial size was normal in size. Pericardium: A small pericardial effusion is present. Mitral Valve: The mitral valve is normal in structure. Normal mobility of the mitral valve leaflets. No evidence of mitral valve regurgitation. No evidence of mitral valve stenosis. Tricuspid Valve: The tricuspid valve is normal in structure. Tricuspid valve regurgitation is not demonstrated. No evidence of tricuspid stenosis. Aortic Valve: The aortic valve is normal in structure. Aortic valve regurgitation is not visualized. No aortic stenosis is present. Aortic valve mean gradient measures 5.0 mmHg. Aortic valve peak gradient measures 8.4 mmHg. Aortic valve area, by VTI measures 1.80 cm. Pulmonic Valve: The pulmonic valve was normal in structure. Pulmonic valve regurgitation is not visualized. No evidence of pulmonic stenosis. Aorta: The aortic root is normal in size and structure. Venous: The inferior vena cava is normal in size with greater than 50% respiratory variability, suggesting right atrial pressure of 3 mmHg. IAS/Shunts: No atrial level shunt detected by color flow Doppler.  LEFT VENTRICLE PLAX 2D LVIDd:         5.15 cm  Diastology LVIDs:         3.03 cm  LV e' lateral:   5.77 cm/s LV PW:         1.24 cm  LV E/e' lateral: 13.2 LV IVS:  1.08 cm  LV e' medial:    5.55 cm/s LVOT diam:     2.00 cm  LV E/e' medial:  13.7 LV SV:         49 LV SV Index:   20 LVOT Area:     3.14 cm  RIGHT VENTRICLE RV Basal diam:  4.42 cm RV S prime:     16.40 cm/s LEFT ATRIUM             Index       RIGHT ATRIUM           Index LA diam:        3.70 cm 1.50 cm/m  RA Area:      20.00 cm LA Vol (A2C):   56.9 ml 23.08 ml/m RA Volume:   52.00 ml  21.09 ml/m LA Vol (A4C):   41.4 ml 16.79 ml/m LA Biplane Vol: 49.7 ml 20.16 ml/m  AORTIC VALVE                    PULMONIC VALVE AV Area (Vmax):    1.80 cm     RVOT Peak grad: 3 mmHg AV Area (Vmean):   1.82 cm AV Area (VTI):     1.80 cm AV Vmax:           144.67 cm/s AV Vmean:          101.633 cm/s AV VTI:            0.273 m AV Peak Grad:      8.4 mmHg AV Mean Grad:      5.0 mmHg LVOT Vmax:         82.80 cm/s LVOT Vmean:        58.900 cm/s LVOT VTI:          0.157 m LVOT/AV VTI ratio: 0.57  AORTA Ao Root diam: 3.00 cm MITRAL VALVE               TRICUSPID VALVE MV Area (PHT): 3.37 cm    TR Peak grad:   13.7 mmHg MV Decel Time: 225 msec    TR Vmax:        185.00 cm/s MV E velocity: 76.20 cm/s MV A velocity: 72.30 cm/s  SHUNTS MV E/A ratio:  1.05        Systemic VTI:  0.16 m                            Systemic Diam: 2.00 cm Ida Rogue MD Electronically signed by Ida Rogue MD Signature Date/Time: 07/31/2019/2:55:27 PM    Final     Management plans discussed with the patient, family and they are in agreement.  CODE STATUS:     Code Status Orders  (From admission, onward)         Start     Ordered   07/30/19 0859  Full code  Continuous     07/30/19 0914        Code Status History    This patient has a current code status but no historical code status.   Advance Care Planning Activity      TOTAL TIME TAKING CARE OF THIS PATIENT: 35 minutes.    Loletha Grayer M.D on 08/01/2019 at 3:23 PM  Between 7am to 6pm - Pager - [phone]  After 6pm go to [email or web address] - password [email or web address]  [facility]  CC: Primary care physician; Rogelia Rohrer, MD

## 2019-08-01 NOTE — Progress Notes (Signed)
ReDS Pro reading not appropriate for this pt due to BMI > 38. 

## 2019-08-01 NOTE — Progress Notes (Signed)
Patient educated on discharge instructions and verbalized understanding. Patient will be driven home by fiance.

## 2019-08-01 NOTE — Care Management Important Message (Signed)
Important Message  Patient Details  Name: Albert Johnson MRN: [id number] Date of Birth: [birth year]   Medicare Important Message Given:  Yes     Johnell Comings 08/01/2019, 1:57 PM

## 2019-08-01 NOTE — Discharge Instructions (Signed)

## 2019-08-04 ENCOUNTER — Other Ambulatory Visit: Payer: Self-pay | Admitting: *Deleted

## 2019-08-04 DIAGNOSIS — I5032 Chronic diastolic (congestive) heart failure: Secondary | ICD-10-CM

## 2019-08-07 ENCOUNTER — Telehealth: Payer: Self-pay | Admitting: Family

## 2019-08-07 NOTE — Telephone Encounter (Signed)
Spoke to patient regarding his new patient chf clinic appointment with [place] and he confirmed on 5/24. He said he is doing very well and has no complaints. He is checking his weight daily, following a low sodium diet, and taking his medications as prescribed and has no symptoms at this time.   Deetta Perla, [place]

## 2019-08-08 ENCOUNTER — Other Ambulatory Visit: Payer: Self-pay

## 2019-08-08 ENCOUNTER — Encounter: Payer: Self-pay | Admitting: Cardiology

## 2019-08-08 ENCOUNTER — Ambulatory Visit (INDEPENDENT_AMBULATORY_CARE_PROVIDER_SITE_OTHER): Payer: Medicare Other | Admitting: Cardiology

## 2019-08-08 VITALS — BP 154/88 | HR 57 | Ht 71.0 in | Wt 285.0 lb

## 2019-08-08 DIAGNOSIS — G4733 Obstructive sleep apnea (adult) (pediatric): Secondary | ICD-10-CM | POA: Diagnosis not present

## 2019-08-08 DIAGNOSIS — I10 Essential (primary) hypertension: Secondary | ICD-10-CM

## 2019-08-08 DIAGNOSIS — K2100 Gastro-esophageal reflux disease with esophagitis, without bleeding: Secondary | ICD-10-CM | POA: Diagnosis not present

## 2019-08-08 DIAGNOSIS — F17200 Nicotine dependence, unspecified, uncomplicated: Secondary | ICD-10-CM | POA: Diagnosis not present

## 2019-08-08 MED ORDER — FUROSEMIDE 40 MG PO TABS: ORAL_TABLET | ORAL | 0 refills | Status: DC

## 2019-08-08 MED ORDER — HYDRALAZINE HCL 50 MG PO TABS
50.0000 mg | ORAL_TABLET | Freq: Three times a day (TID) | ORAL | 3 refills | Status: DC
Start: 2019-08-08 — End: 2019-10-13

## 2019-08-08 NOTE — Progress Notes (Signed)
Cardiology Office Note:    Date:  08/08/2019   ID:  Albert Johnson, DOB May 28, 1950, MRN [id number]  PCP:  Corliss Parish, MD  Cardiologist:  No primary care provider on file.  Electrophysiologist:  None   Referring MD: Corliss Parish, MD   Chief Complaint  Patient presents with  . Other    Hospital follwo up. paitent c.o chest pain and SOB. Meds reviewed verbally with patient.     History of Present Illness:    Albert Johnson is a 69 y.o. male with a hx of hypertension, hyperlipidemia, COPD, OSA, current smoker x50 years who presents for follow-up.  Patient seen about a week ago in the ED due to shortness of breath with minimal exertion and edema.  He also endorsed orthopnea.  Cardiomegaly and pulmonary edema noted on chest x-ray.  Echocardiogram showed normal systolic function, EF 60 to 65%, mild LVH, impaired relaxation.  Was diagnosed with hypertensive heart disease, managed with diuresing.  Blood pressure medication was increased.  Discharge, he was started on losartan 100 daily, Imdur 60 daily, Lasix 40 daily, Lopressor 25 twice daily, patient has been taking hydralazine twice daily instead of 3 times daily.  He checks his weight daily which has been around 279 pounds.  He still smokes. He states having an episode of chest discomfort today which he attributes to reflux.  Symptoms improved after drinking water.  He takes Prilosec 20 mg daily.  Plans to follow-up with pulmonary medicine regarding sleep apnea.  Past Medical History:  Diagnosis Date  . Arthritis   . CHF (congestive heart failure) (HCC)   . COPD (chronic obstructive pulmonary disease) (HCC)   . Hyperlipidemia   . Hypertension   . Renal disorder     Past Surgical History:  Procedure Laterality Date  . CORONARY ARTERY BYPASS GRAFT    . REPLACEMENT TOTAL KNEE      Current Medications: Current Meds  Medication Sig  . aspirin 81 MG EC tablet Take by mouth.  Marland Kitchen atorvastatin (LIPITOR) 10 MG tablet Take 10 mg by mouth daily.  .  furosemide (LASIX) 40 MG tablet Take 1 tablet (40mg ) once a day as needed.  . isosorbide mononitrate (IMDUR) 60 MG 24 hr tablet Take 1 tablet (60 mg total) by mouth daily.  losartan (COZAAR) 100 MG tablet Take 1 tablet (100 mg total) by mouth daily.  . metoprolol tartrate (LOPRESSOR) 25 MG tablet Take 1 tablet (25 mg total) by mouth 2 (two) times daily.  Marland Kitchen omeprazole (PRILOSEC) 20 MG capsule Take 20 mg by mouth daily.  . [DISCONTINUED] atorvastatin (LIPITOR) 80 MG tablet Take 1 tablet (80 mg total) by mouth at bedtime.  . [DISCONTINUED] furosemide (LASIX) 40 MG tablet Take 1 tablet (40 mg total) by mouth daily.  . [DISCONTINUED] hydrALAZINE (APRESOLINE) 10 MG tablet Take 1 tablet (10 mg total) by mouth every 8 (eight) hours.     Allergies:   Patient has no known allergies.   Social History   Socioeconomic History  . Marital status: Divorced    Spouse name: Not on file  . Number of children: Not on file  . Years of education: Not on file  . Highest education level: Not on file  Occupational History  . Not on file  Tobacco Use  . Smoking status: Current Every Day Smoker    Packs/day: 0.50    Types: Cigarettes  . Smokeless tobacco: Never Used  Substance and Sexual Activity  . Alcohol use: No  . Drug use:  No  . Sexual activity: Not on file  Other Topics Concern  . Not on file  Social History Narrative  . Not on file   Social Determinants of Health   Financial Resource Strain:   . Difficulty of Paying Living Expenses:   Food Insecurity:   . Worried About Charity fundraiser in the Last Year:   . Arboriculturist in the Last Year:   Transportation Needs:   . Film/video editor (Medical):   Marland Kitchen Lack of Transportation (Non-Medical):   Physical Activity:   . Days of Exercise per Week:   . Minutes of Exercise per Session:   Stress:   . Feeling of Stress :   Social Connections:   . Frequency of Communication with Friends and Family:   . Frequency of Social Gatherings with  Friends and Family:   . Attends Religious Services:   . Active Member of Clubs or Organizations:   . Attends Archivist Meetings:   Marland Kitchen Marital Status:      Family History: The patient's family history is not on file.  ROS:   Please see the history of present illness.     All other systems reviewed and are negative.  EKGs/Labs/Other Studies Reviewed:    The following studies were reviewed today:   EKG:  EKG is  ordered today.  The ekg ordered today demonstrates sinus bradycardia  Recent Labs: 07/30/2019: ALT 57; B Natriuretic Peptide 614.0; Hemoglobin 15.1; Platelets 193 08/01/2019: BUN 22; Creatinine, Ser 1.34; Potassium 3.8; Sodium 141  Recent Lipid Panel No results found for: CHOL, TRIG, HDL, CHOLHDL, VLDL, LDLCALC, LDLDIRECT  Physical Exam:    VS:  BP (!) 154/88 (BP Location: Left Arm, Patient Position: Sitting, Cuff Size: Normal)   Pulse (!) 57   Ht 5\' 11"  (1.803 m)   Wt 285 lb (129.3 kg)   SpO2 97%   BMI 39.75 kg/m     Wt Readings from Last 3 Encounters:  08/08/19 285 lb (129.3 kg)  08/01/19 281 lb (127.5 kg)  11/16/16 285 lb (129.3 kg)     GEN:  Well nourished, well developed in no acute distress HEENT: Normal NECK: No JVD; No carotid bruits LYMPHATICS: No lymphadenopathy CARDIAC: RRR, no murmurs, rubs, gallops RESPIRATORY:  Clear to auscultation without rales, wheezing or rhonchi  ABDOMEN: Soft, non-tender, non-distended MUSCULOSKELETAL:  No edema; No deformity  SKIN: Warm and dry NEUROLOGIC:  Alert and oriented x 3 PSYCHIATRIC:  Normal affect   ASSESSMENT:    1. Essential hypertension   2. Smoking   3. Gastroesophageal reflux disease with esophagitis without hemorrhage   4. OSA (obstructive sleep apnea)    PLAN:    In order of problems listed above:  1. Patient with history of hypertension.  Blood pressure still elevated today.  Change hydralazine dosing to 50 mg twice daily.  Continue losartan, Imdur 60.  Can take Lasix daily as needed  for weight gain.  If blood pressure not controlled, plan to add calcium channel blocker. 2. Patient is a current smoker.  Smoking cessation advised.  Over 5 minutes spent counseling patient. 3. History of reflux, advised patient to increase Prilosec to 40 mg daily. 4. Patient with history of sleep apnea.  Has appointment to follow-up with primary care provider in [facility] regarding new CPAP machine.  Encourage patient to keep appointment.  Follow-up in 1 month for blood pressure.   Medication Adjustments/Labs and Tests Ordered: Current medicines are reviewed at length with  the patient today.  Concerns regarding medicines are outlined above.  Orders Placed This Encounter  Procedures  . EKG 12-Lead   Meds ordered this encounter  Medications  . hydrALAZINE (APRESOLINE) 50 MG tablet    Sig: Take 1 tablet (50 mg total) by mouth 3 (three) times daily.    Dispense:  270 tablet    Refill:  3  . furosemide (LASIX) 40 MG tablet    Sig: Take 1 tablet (40mg ) once a day as needed.    Dispense:  30 tablet    Refill:  0    Patient Instructions  Medication Instructions:  Your physician has recommended you make the following change in your medication:  1. Increase your Hydralazine to 50mg  (1 tab) twice a day. 2. Change your Lasix (Furosemide) 40mg  (1 tab) to once a day as needed.  *If you need a refill on your cardiac medications before your next appointment, please call your pharmacy*   Lab Work: None Ordered. If you have labs (blood work) drawn today and your tests are completely normal, you will receive your results only by: MyChart Message (if you have MyChart) OR . A paper copy in the mail If you have any lab test that is abnormal or we need to change your treatment, we will call you to review the results.   Testing/Procedures: None Ordered.   Follow-Up: At [facility], you and your health needs are our priority.  As part of our continuing mission to provide you with  exceptional heart care, we have created designated Provider Care Teams.  These Care Teams include your primary Cardiologist (physician) and Advanced Practice Providers (APPs -  Physician Assistants and Nurse Practitioners) who all work together to provide you with the care you need, when you need it.  We recommend signing up for the patient portal called "MyChart".  Sign up information is provided on this After Visit Summary.  MyChart is used to connect with patients for Virtual Visits (Telemedicine).  Patients are able to view lab/test results, encounter notes, upcoming appointments, etc.  Non-urgent messages can be sent to your provider as well.   To learn more about what you can do with MyChart, go to .    Your next appointment:   1 month(s)  The format for your next appointment:   In Person  Provider:   Marland Kitchen, MD   Other Instructions      Signed, [facility], MD  08/08/2019 12:29 PM    [facility]

## 2019-08-08 NOTE — Patient Instructions (Signed)
Medication Instructions:  Your physician has recommended you make the following change in your medication:  1. Increase your Hydralazine to 50mg  (1 tab) twice a day. 2. Change your Lasix (Furosemide) 40mg  (1 tab) to once a day as needed.  *If you need a refill on your cardiac medications before your next appointment, please call your pharmacy*   Lab Work: None Ordered. If you have labs (blood work) drawn today and your tests are completely normal, you will receive your results only by: MyChart Message (if you have MyChart) OR . A paper copy in the mail If you have any lab test that is abnormal or we need to change your treatment, we will call you to review the results.   Testing/Procedures: None Ordered.   Follow-Up: At [facility], you and your health needs are our priority.  As part of our continuing mission to provide you with exceptional heart care, we have created designated Provider Care Teams.  These Care Teams include your primary Cardiologist (physician) and Advanced Practice Providers (APPs -  Physician Assistants and Nurse Practitioners) who all work together to provide you with the care you need, when you need it.  We recommend signing up for the patient portal called "MyChart".  Sign up information is provided on this After Visit Summary.  MyChart is used to connect with patients for Virtual Visits (Telemedicine).  Patients are able to view lab/test results, encounter notes, upcoming appointments, etc.  Non-urgent messages can be sent to your provider as well.   To learn more about what you can do with MyChart, go to Marland Kitchen.    Your next appointment:   1 month(s)  The format for your next appointment:   In Person  Provider:   [facility], MD   Other Instructions

## 2019-08-11 ENCOUNTER — Ambulatory Visit: Payer: Medicare Other | Admitting: Family

## 2019-08-14 ENCOUNTER — Telehealth: Payer: Self-pay | Admitting: Cardiology

## 2019-08-14 NOTE — Telephone Encounter (Signed)
Debbe Odea, MD  You 16 minutes ago (2:25 PM)   Patient's primary care physician Dr. Dellis Filbert was called at 2:23 PM today. It went to physicians voicemail and message was left.    Routing comment

## 2019-08-14 NOTE — Telephone Encounter (Signed)
Routing to Dr Azucena Cecil to call Dr Dellis Filbert back at his cell number [phone].  Scheduler said Dr Dellis Filbert wanted to make sure Dr. Azucena Cecil knew he was doing cardiac testing as well and wanted to speak directly to Dr. Azucena Cecil.

## 2019-08-14 NOTE — Telephone Encounter (Signed)
Dr Dellis Filbert calling back. Call transferred to Dr. Azucena Cecil.

## 2019-08-14 NOTE — Telephone Encounter (Signed)
Dr. Dellis Filbert calling to discuss patient and his prior history with Dr. Dellis Filbert and some testing that he is having done. Dr. Dellis Filbert would like to personally speak with Dr. Azucena Cecil about this and the patients care moving forward

## 2019-09-15 ENCOUNTER — Ambulatory Visit: Payer: Medicare Other | Admitting: Cardiology

## 2019-09-29 ENCOUNTER — Telehealth: Payer: Self-pay | Admitting: Cardiology

## 2019-09-29 NOTE — Telephone Encounter (Signed)
Patient's PCP called, states he placed a monitor on patient which showed no aflutter, SVTs, and 12 episodes of wide complex tachycardia. Please call to discuss. Also gave an option to email [email or web address]

## 2019-09-30 NOTE — Telephone Encounter (Signed)
Left a message With Dr. Jonny Ruiz Min's nurse.requesting the cardiac monitor report to be faxed to our office as requested by Dr. Azucena Cecil.

## 2019-10-13 ENCOUNTER — Encounter: Payer: Self-pay | Admitting: Cardiology

## 2019-10-13 ENCOUNTER — Ambulatory Visit (INDEPENDENT_AMBULATORY_CARE_PROVIDER_SITE_OTHER): Payer: Medicare Other | Admitting: Cardiology

## 2019-10-13 ENCOUNTER — Other Ambulatory Visit: Payer: Self-pay

## 2019-10-13 VITALS — BP 150/80 | HR 70 | Ht 71.0 in | Wt 302.4 lb

## 2019-10-13 DIAGNOSIS — F17200 Nicotine dependence, unspecified, uncomplicated: Secondary | ICD-10-CM | POA: Diagnosis not present

## 2019-10-13 DIAGNOSIS — I10 Essential (primary) hypertension: Secondary | ICD-10-CM | POA: Diagnosis not present

## 2019-10-13 MED ORDER — HYDRALAZINE HCL 100 MG PO TABS: 100.0000 mg | ORAL_TABLET | Freq: Three times a day (TID) | ORAL | 5 refills | Status: AC

## 2019-10-13 MED ORDER — CARVEDILOL 6.25 MG PO TABS: 6.2500 mg | ORAL_TABLET | Freq: Two times a day (BID) | ORAL | 5 refills | Status: AC

## 2019-10-13 NOTE — Patient Instructions (Signed)
Medication Instructions:  Your physician has recommended you make the following change in your medication:   1) STOP Metoprolol 2) START Carvedilol 6.25 mg twice daily 3) INCREASE Hydralazine to 100 mg three times daily  *If you need a refill on your cardiac medications before your next appointment, please call your pharmacy*   Lab Work: None ordered If you have labs (blood work) drawn today and your tests are completely normal, you will receive your results only by: Marland Kitchen MyChart Message (if you have MyChart) OR . A paper copy in the mail If you have any lab test that is abnormal or we need to change your treatment, we will call you to review the results.   Testing/Procedures: None ordered   Follow-Up: At [facility] you and your health needs are our priority.  As part of our continuing mission to provide you with exceptional heart care, we have created designated Provider Care Teams.  These Care Teams include your primary Cardiologist (physician) and Advanced Practice Providers (APPs -  Physician Assistants and Nurse Practitioners) who all work together to provide you with the care you need, when you need it.  We recommend signing up for the patient portal called "MyChart".  Sign up information is provided on this After Visit Summary.  MyChart is used to connect with patients for Virtual Visits (Telemedicine).  Patients are able to view lab/test results, encounter notes, upcoming appointments, etc.  Non-urgent messages can be sent to your provider as well.   To learn more about what you can do with MyChart, go to [email or web address].    Your next appointment:   2 month(s)  The format for your next appointment:   In Person  Provider:    You may see Dr. Azucena Cecil or one of the following Advanced Practice Providers on your designated Care Team:    Nicolasa Ducking, NP  Eula Listen, PA-C  Marisue Ivan, PA-C    Other Instructions N/A

## 2019-10-13 NOTE — Progress Notes (Signed)
Cardiology Office Note:    Date:  10/13/2019   ID:  Albert Johnson, DOB 06-May-1950, MRN [id number]  PCP:  Corliss Parish, MD  Cardiologist:  No primary care provider on file.  Electrophysiologist:  None   Referring MD: Corliss Parish, MD   Chief Complaint  Patient presents with  . OTHER    1 month f/u. Meds reviewed verbally with pt.    History of Present Illness:    Albert Johnson is a 69 y.o. male with a hx of hypertension, hyperlipidemia, COPD, OSA, current smoker x50 years who presents for follow-up.  Has a history of uncontrolled hypertension, heart failure preserved ejection fraction.  Blood pressure is elevated after last visit, hydralazine dose was increased.  Patient's blood pressure is still elevated at home with systolic in the 140s 150s.  He is tolerating his medicines okay.  He states using extra doses of Lasix as needed to help keep fluid in his legs off.  Has no concerns at this time.  Previous notes Patient was seenin the  ED due to shortness of breath with minimal exertion and edema.  He also endorsed orthopnea.  Cardiomegaly and pulmonary edema noted on chest x-ray.  Echocardiogram showed normal systolic function, EF 60 to 65%, mild LVH, impaired relaxation.  Was diagnosed with hypertensive heart disease, managed with diuresing.  Blood pressure medication was increased.  Discharge, he was started on losartan 100 daily, Imdur 60 daily, Lasix 40 daily, Lopressor 25 twice daily, patient has been taking hydralazine twice daily instead of 3 times daily.    Past Medical History:  Diagnosis Date  . Arthritis   . CHF (congestive heart failure) (HCC)   . COPD (chronic obstructive pulmonary disease) (HCC)   . Hyperlipidemia   . Hypertension   . Renal disorder     Past Surgical History:  Procedure Laterality Date  . CORONARY ARTERY BYPASS GRAFT    . REPLACEMENT TOTAL KNEE      Current Medications: Current Meds  Medication Sig  . aspirin 81 MG EC tablet Take by mouth.  Marland Kitchen  atorvastatin (LIPITOR) 10 MG tablet Take 10 mg by mouth daily.  . furosemide (LASIX) 40 MG tablet Take 1 tablet (40mg ) once a day as needed.  . hydrALAZINE (APRESOLINE) 100 MG tablet Take 1 tablet (100 mg total) by mouth 3 (three) times daily. Dosage increase  . IBUPROFEN PO Take by mouth as needed.  . isosorbide mononitrate (IMDUR) 60 MG 24 hr tablet Take 1 tablet (60 mg total) by mouth daily.  losartan (COZAAR) 100 MG tablet Take 1 tablet (100 mg total) by mouth daily.  Marland Kitchen omeprazole (PRILOSEC) 20 MG capsule Take 20 mg by mouth daily.  Marland Kitchen VITAMIN D PO Take by mouth daily.  . [DISCONTINUED] hydrALAZINE (APRESOLINE) 50 MG tablet Take 1 tablet (50 mg total) by mouth 3 (three) times daily.  . [DISCONTINUED] metoprolol tartrate (LOPRESSOR) 25 MG tablet Take 1 tablet (25 mg total) by mouth 2 (two) times daily.     Allergies:   Patient has no known allergies.   Social History   Socioeconomic History  . Marital status: Divorced    Spouse name: Not on file  . Number of children: Not on file  . Years of education: Not on file  . Highest education level: Not on file  Occupational History  . Not on file  Tobacco Use  . Smoking status: Current Every Day Smoker    Packs/day: 0.50    Types: Cigarettes  . Smokeless  tobacco: Never Used  Substance and Sexual Activity  . Alcohol use: No  . Drug use: No  . Sexual activity: Not on file  Other Topics Concern  . Not on file  Social History Narrative  . Not on file   Social Determinants of Health   Financial Resource Strain:   . Difficulty of Paying Living Expenses:   Food Insecurity:   . Worried About Programme researcher, broadcasting/film/video in the Last Year:   . Barista in the Last Year:   Transportation Needs:   . Freight forwarder (Medical):   Marland Kitchen Lack of Transportation (Non-Medical):   Physical Activity:   . Days of Exercise per Week:   . Minutes of Exercise per Session:   Stress:   . Feeling of Stress :   Social Connections:   . Frequency  of Communication with Friends and Family:   . Frequency of Social Gatherings with Friends and Family:   . Attends Religious Services:   . Active Member of Clubs or Organizations:   . Attends Banker Meetings:   Marland Kitchen Marital Status:      Family History: The patient's family history is not on file.  ROS:   Please see the history of present illness.     All other systems reviewed and are negative.  EKGs/Labs/Other Studies Reviewed:    The following studies were reviewed today:   EKG:  EKG is  ordered today.  The ekg ordered today demonstrates normal sinus rhythm.  Recent Labs: 07/30/2019: ALT 57; B Natriuretic Peptide 614.0; Hemoglobin 15.1; Platelets 193 08/01/2019: BUN 22; Creatinine, Ser 1.34; Potassium 3.8; Sodium 141  Recent Lipid Panel No results found for: CHOL, TRIG, HDL, CHOLHDL, VLDL, LDLCALC, LDLDIRECT  Physical Exam:    VS:  BP (!) 150/80 (BP Location: Left Arm, Patient Position: Sitting, Cuff Size: Large)   Pulse 70   Ht 5\' 11"  (1.803 m)   Wt (!) 302 lb 6 oz (137.2 kg)   SpO2 98%   BMI 42.17 kg/m     Wt Readings from Last 3 Encounters:  10/13/19 (!) 302 lb 6 oz (137.2 kg)  08/08/19 285 lb (129.3 kg)  08/01/19 281 lb (127.5 kg)     GEN:  Well nourished, well developed in no acute distress HEENT: Normal NECK: No JVD; No carotid bruits LYMPHATICS: No lymphadenopathy CARDIAC: RRR, no murmurs, rubs, gallops RESPIRATORY:  Clear to auscultation without rales, wheezing or rhonchi  ABDOMEN: Soft, non-tender, non-distended MUSCULOSKELETAL:  trace edema; No deformity  SKIN: Warm and dry NEUROLOGIC:  Alert and oriented x 3 PSYCHIATRIC:  Normal affect   ASSESSMENT:    1. Essential hypertension   2. Smoking    PLAN:    In order of problems listed above:  1. Patient with history of hypertension.  Blood pressure still elevated today.  Increase hydralazine to 100 mg 3 times daily, stop Lopressor, start Coreg 6.25 twice daily.  Continue losartan, Imdur  60.  Trying to avoid amlodipine due to edema, likely from diastolic dysfunction. 2. Patient is a current smoker.  Smoking cessation advised.   Follow-up in 2 months  Total encounter time  40 minutes  Greater than 50% was spent in counseling and coordination of care with the patient  Medication Adjustments/Labs and Tests Ordered: Current medicines are reviewed at length with the patient today.  Concerns regarding medicines are outlined above.  Orders Placed This Encounter  Procedures  . EKG 12-Lead   Meds ordered this encounter  Medications  . hydrALAZINE (APRESOLINE) 100 MG tablet    Sig: Take 1 tablet (100 mg total) by mouth 3 (three) times daily. Dosage increase    Dispense:  90 tablet    Refill:  5  . carvedilol (COREG) 6.25 MG tablet    Sig: Take 1 tablet (6.25 mg total) by mouth 2 (two) times daily.    Dispense:  60 tablet    Refill:  5    Patient Instructions  Medication Instructions:  Your physician has recommended you make the following change in your medication:   1) STOP Metoprolol 2) START Carvedilol 6.25 mg twice daily 3) INCREASE Hydralazine to 100 mg three times daily  *If you need a refill on your cardiac medications before your next appointment, please call your pharmacy*   Lab Work: None ordered If you have labs (blood work) drawn today and your tests are completely normal, you will receive your results only by: Marland Kitchen MyChart Message (if you have MyChart) OR . A paper copy in the mail If you have any lab test that is abnormal or we need to change your treatment, we will call you to review the results.   Testing/Procedures: None ordered   Follow-Up: At [facility], you and your health needs are our priority.  As part of our continuing mission to provide you with exceptional heart care, we have created designated Provider Care Teams.  These Care Teams include your primary Cardiologist (physician) and Advanced Practice Providers (APPs -  Physician  Assistants and Nurse Practitioners) who all work together to provide you with the care you need, when you need it.  We recommend signing up for the patient portal called "MyChart".  Sign up information is provided on this After Visit Summary.  MyChart is used to connect with patients for Virtual Visits (Telemedicine).  Patients are able to view lab/test results, encounter notes, upcoming appointments, etc.  Non-urgent messages can be sent to your provider as well.   To learn more about what you can do with MyChart, go to [email or web address].    Your next appointment:   2 month(s)  The format for your next appointment:   In Person  Provider:    You may see Dr. Azucena Cecil or one of the following Advanced Practice Providers on your designated Care Team:    Nicolasa Ducking, NP  Eula Listen, PA-C  Marisue Ivan, PA-C    Other Instructions N/A     Signed, Debbe Odea, MD  10/13/2019 12:48 PM    [facility]

## 2019-12-02 ENCOUNTER — Telehealth: Payer: Self-pay | Admitting: Cardiology

## 2019-12-02 MED ORDER — FUROSEMIDE 40 MG PO TABS: 40.0000 mg | ORAL_TABLET | Freq: Two times a day (BID) | ORAL | 3 refills | Status: AC

## 2019-12-02 MED ORDER — ISOSORBIDE MONONITRATE ER 60 MG PO TB24: 120.0000 mg | ORAL_TABLET | Freq: Every day | ORAL | 3 refills | Status: DC

## 2019-12-02 NOTE — Telephone Encounter (Signed)
Dr Theadora Rama from [facility] primary care calling Wanting to discuss medication changes that she made with patient today We see patient 09/27 and would like for [place] to follow up with him then Please call to discuss  Office [phone] Cell [phone]

## 2019-12-02 NOTE — Telephone Encounter (Signed)
Spoke with Dr. Theadora Rama. She saw patient today for mild exacerbation of CHF and some volume overload. She wanted to give Dr. Azucena Cecil a review of the medications changes she made today, will list below. Patient will be seen in office with [place] on 12/15/19.  Increased lasix from 40mg  QD to BID. Increased Imdur from 60mg  QD to 120mg  QD.  Systolic BP was in the 150's, HR was in the 50's so she did not increase the Carvedilol.  Will update medication orders on the [facility] and route to Dr. .

## 2019-12-15 ENCOUNTER — Encounter: Payer: Self-pay | Admitting: Cardiology

## 2019-12-15 ENCOUNTER — Ambulatory Visit (INDEPENDENT_AMBULATORY_CARE_PROVIDER_SITE_OTHER): Payer: Medicare Other | Admitting: Cardiology

## 2019-12-15 ENCOUNTER — Other Ambulatory Visit: Payer: Self-pay

## 2019-12-15 VITALS — BP 100/60 | HR 59 | Ht 71.0 in | Wt 299.0 lb

## 2019-12-15 DIAGNOSIS — I10 Essential (primary) hypertension: Secondary | ICD-10-CM

## 2019-12-15 DIAGNOSIS — Z87891 Personal history of nicotine dependence: Secondary | ICD-10-CM

## 2019-12-15 MED ORDER — ISOSORBIDE MONONITRATE ER 60 MG PO TB24: 60.0000 mg | ORAL_TABLET | Freq: Every day | ORAL | 3 refills | Status: AC

## 2019-12-15 NOTE — Patient Instructions (Signed)
Medication Instructions:  Your physician recommends that you continue on your current medications as directed. Please refer to the Current Medication list given to you today.  *If you need a refill on your cardiac medications before your next appointment, please call your pharmacy*   Lab Work: None Ordered If you have labs (blood work) drawn today and your tests are completely normal, you will receive your results only by: . MyChart Message (if you have MyChart) OR . A paper copy in the mail If you have any lab test that is abnormal or we need to change your treatment, we will call you to review the results.   Testing/Procedures: None Ordered   Follow-Up: At [facility], you and your health needs are our priority.  As part of our continuing mission to provide you with exceptional heart care, we have created designated Provider Care Teams.  These Care Teams include your primary Cardiologist (physician) and Advanced Practice Providers (APPs -  Physician Assistants and Nurse Practitioners) who all work together to provide you with the care you need, when you need it.  We recommend signing up for the patient portal called "MyChart".  Sign up information is provided on this After Visit Summary.  MyChart is used to connect with patients for Virtual Visits (Telemedicine).  Patients are able to view lab/test results, encounter notes, upcoming appointments, etc.  Non-urgent messages can be sent to your provider as well.   To learn more about what you can do with MyChart, go to [email or web address].    Your next appointment:   3 month(s)  The format for your next appointment:   In Person  Provider:   Brian Agbor-Etang, MD   Other Instructions   

## 2019-12-15 NOTE — Progress Notes (Signed)
Cardiology Office Note:    Date:  12/15/2019   ID:  Albert Johnson, DOB Dec 14, 1950, MRN [id number]  PCP:  Corliss Parish, MD  Cardiologist:  No primary care provider on file.  Electrophysiologist:  None   Referring MD: Corliss Parish, MD   Chief Complaint  Patient presents with  . other    2 month follow up. Meds reviewed by the pt. verbally. "doing well."     History of Present Illness:    Albert Johnson is a 69 y.o. male with a hx of hypertension, hyperlipidemia, COPD, OSA, former smoker x50 years who presents for follow-up.  Has a history of uncontrolled hypertension, heart failure preserved ejection fraction.  His hydralazine was increased after last visit, Coreg was started.   Patient feels great, blood pressure is better controlled with systolics at home in the 120s to 130s.  Tolerating medications without any side effects.  Feels well.  Recently quit smoking.  Previous notes Patient was seenin the  ED due to shortness of breath with minimal exertion and edema.  He also endorsed orthopnea.  Cardiomegaly and pulmonary edema noted on chest x-ray.  Echocardiogram showed normal systolic function, EF 60 to 65%, mild LVH, impaired relaxation.  Was diagnosed with hypertensive heart disease, managed with diuresing.  Blood pressure medication was increased.  Discharge, he was started on losartan 100 daily, Imdur 60 daily, Lasix 40 daily.  Past Medical History:  Diagnosis Date  . Arthritis   . CHF (congestive heart failure) (HCC)   . COPD (chronic obstructive pulmonary disease) (HCC)   . Hyperlipidemia   . Hypertension   . Renal disorder     Past Surgical History:  Procedure Laterality Date  . CORONARY ARTERY BYPASS GRAFT    . REPLACEMENT TOTAL KNEE      Current Medications: Current Meds  Medication Sig  . albuterol (VENTOLIN HFA) 108 (90 Base) MCG/ACT inhaler INHALE 2 PUFFS BY MOUTH EVERY 6 HOURS AS NEEDED FOR WHEEZING  . aspirin 81 MG EC tablet Take by mouth.  Marland Kitchen atorvastatin  (LIPITOR) 10 MG tablet Take 10 mg by mouth daily.  . carvedilol (COREG) 6.25 MG tablet Take 1 tablet (6.25 mg total) by mouth 2 (two) times daily.  . furosemide (LASIX) 40 MG tablet Take 1 tablet (40 mg total) by mouth 2 (two) times daily. Take 1 tablet (40mg ) once a day as needed.  . gabapentin (NEURONTIN) 300 MG capsule Take 300 mg by mouth 3 (three) times daily.   . hydrALAZINE (APRESOLINE) 100 MG tablet Take 1 tablet (100 mg total) by mouth 3 (three) times daily. Dosage increase  . IBUPROFEN PO Take by mouth as needed.  . isosorbide mononitrate (IMDUR) 60 MG 24 hr tablet Take 1 tablet (60 mg total) by mouth daily.  losartan (COZAAR) 100 MG tablet Take 1 tablet (100 mg total) by mouth daily.  Marland Kitchen omeprazole (PRILOSEC) 20 MG capsule Take 20 mg by mouth daily.  Marland Kitchen oxyCODONE ER (XTAMPZA ER) 9 MG C12A TAKE ONE CAPSULE BY MOUTH DAILY FOR THE FIRST 3 DAYS IF TOLERATED THEN 1 CAPSULE EVERY 12 HOURS IF TOLERATED. NO BUTRANS OR BUPRENORPHINE  . VITAMIN D PO Take by mouth daily.  . [DISCONTINUED] isosorbide mononitrate (IMDUR) 60 MG 24 hr tablet Take 2 tablets (120 mg total) by mouth daily.     Allergies:   Patient has no known allergies.   Social History   Socioeconomic History  . Marital status: Divorced    Spouse name: Not on file  .  Number of children: Not on file  . Years of education: Not on file  . Highest education level: Not on file  Occupational History  . Not on file  Tobacco Use  . Smoking status: Current Every Day Smoker    Packs/day: 0.50    Types: Cigarettes  . Smokeless tobacco: Never Used  Substance and Sexual Activity  . Alcohol use: No  . Drug use: No  . Sexual activity: Not on file  Other Topics Concern  . Not on file  Social History Narrative  . Not on file   Social Determinants of Health   Financial Resource Strain:   . Difficulty of Paying Living Expenses: Not on file  Food Insecurity:   . Worried About Programme researcher, broadcasting/film/video in the Last Year: Not on file  .  Ran Out of Food in the Last Year: Not on file  Transportation Needs:   . Lack of Transportation (Medical): Not on file  . Lack of Transportation (Non-Medical): Not on file  Physical Activity:   . Days of Exercise per Week: Not on file  . Minutes of Exercise per Session: Not on file  Stress:   . Feeling of Stress : Not on file  Social Connections:   . Frequency of Communication with Friends and Family: Not on file  . Frequency of Social Gatherings with Friends and Family: Not on file  . Attends Religious Services: Not on file  . Active Member of Clubs or Organizations: Not on file  . Attends Banker Meetings: Not on file  . Marital Status: Not on file     Family History: The patient's family history is not on file.  ROS:   Please see the history of present illness.     All other systems reviewed and are negative.  EKGs/Labs/Other Studies Reviewed:    The following studies were reviewed today:   EKG:  EKG is  ordered today.  The ekg ordered today demonstrates sinus bradycardia.  Recent Labs: 07/30/2019: ALT 57; B Natriuretic Peptide 614.0; Hemoglobin 15.1; Platelets 193 08/01/2019: BUN 22; Creatinine, Ser 1.34; Potassium 3.8; Sodium 141  Recent Lipid Panel No results found for: CHOL, TRIG, HDL, CHOLHDL, VLDL, LDLCALC, LDLDIRECT  Physical Exam:    VS:  BP 100/60 (BP Location: Left Arm, Patient Position: Sitting, Cuff Size: Large)   Pulse (!) 59   Ht 5\' 11"  (1.803 m)   Wt 299 lb (135.6 kg)   SpO2 97%   BMI 41.70 kg/m     Wt Readings from Last 3 Encounters:  12/15/19 299 lb (135.6 kg)  10/13/19 (!) 302 lb 6 oz (137.2 kg)  08/08/19 285 lb (129.3 kg)     GEN:  Well nourished, well developed in no acute distress HEENT: Normal NECK: No JVD; No carotid bruits LYMPHATICS: No lymphadenopathy CARDIAC: RRR, no murmurs, rubs, gallops RESPIRATORY:  Clear to auscultation without rales, wheezing or rhonchi  ABDOMEN: Soft, non-tender,  non-distended MUSCULOSKELETAL:  trace edema; No deformity  SKIN: Warm and dry NEUROLOGIC:  Alert and oriented x 3 PSYCHIATRIC:  Normal affect   ASSESSMENT:    1. Essential hypertension   2. Smoking hx    PLAN:    In order of problems listed above:  1. Patient with history of hypertension.  Blood pressure controlled.  Continue current medications for now.  hydralazine to 100 mg 3 times daily,  Coreg 6.25 twice daily.  Continue losartan, Imdur 60.  If BP stays normal to low normal, consider reducing  Imdur. 2. Patient recently stopped smoking.  He was congratulated on this huge achievement.  Encouraged to stay smoking free.  Follow-up in 3 months  Total encounter time  35 minutes  Greater than 50% was spent in counseling and coordination of care with the patient  Medication Adjustments/Labs and Tests Ordered: Current medicines are reviewed at length with the patient today.  Concerns regarding medicines are outlined above.  Orders Placed This Encounter  Procedures  . EKG 12-Lead   Meds ordered this encounter  Medications  . isosorbide mononitrate (IMDUR) 60 MG 24 hr tablet    Sig: Take 1 tablet (60 mg total) by mouth daily.    Dispense:  30 tablet    Refill:  3    Patient Instructions  Medication Instructions:  Your physician recommends that you continue on your current medications as directed. Please refer to the Current Medication list given to you today.   *If you need a refill on your cardiac medications before your next appointment, please call your pharmacy*   Lab Work: None Ordered If you have labs (blood work) drawn today and your tests are completely normal, you will receive your results only by: Marland Kitchen MyChart Message (if you have MyChart) OR . A paper copy in the mail If you have any lab test that is abnormal or we need to change your treatment, we will call you to review the results.   Testing/Procedures: None Ordered   Follow-Up: At [facility], you  and your health needs are our priority.  As part of our continuing mission to provide you with exceptional heart care, we have created designated Provider Care Teams.  These Care Teams include your primary Cardiologist (physician) and Advanced Practice Providers (APPs -  Physician Assistants and Nurse Practitioners) who all work together to provide you with the care you need, when you need it.  We recommend signing up for the patient portal called "MyChart".  Sign up information is provided on this After Visit Summary.  MyChart is used to connect with patients for Virtual Visits (Telemedicine).  Patients are able to view lab/test results, encounter notes, upcoming appointments, etc.  Non-urgent messages can be sent to your provider as well.   To learn more about what you can do with MyChart, go to [email or web address].    Your next appointment:   3 month(s)  The format for your next appointment:   In Person  Provider:   Debbe Odea, MD   Other Instructions     Signed, Debbe Odea, MD  12/15/2019 11:28 AM    [facility]

## 2020-03-15 ENCOUNTER — Ambulatory Visit: Payer: Medicare Other | Admitting: Cardiology

## 2020-03-16 ENCOUNTER — Encounter: Payer: Self-pay | Admitting: Cardiology

## 2021-02-10 IMAGING — US US ABDOMEN LIMITED
1 series · 14 of 25 positions shown · non-contrast
Comparison: September 12, 2013.

CLINICAL DATA: Transaminitis.

EXAM:
ULTRASOUND ABDOMEN LIMITED RIGHT UPPER QUADRANT

[Series 1: us abdomen limited ruq · 14 of 37 slices shown]
[im 1/37]
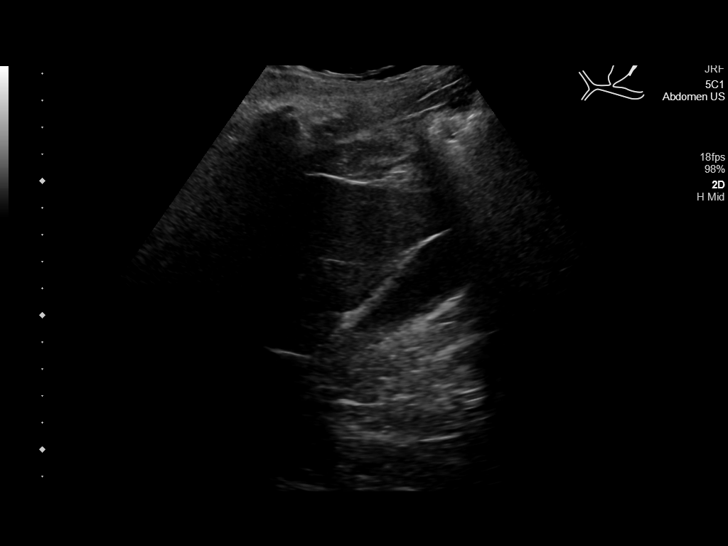
[im 4/37]
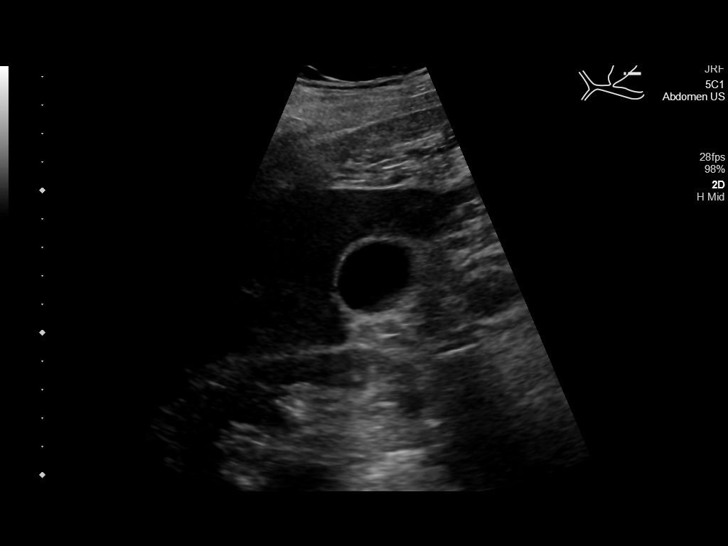
[im 7/37]
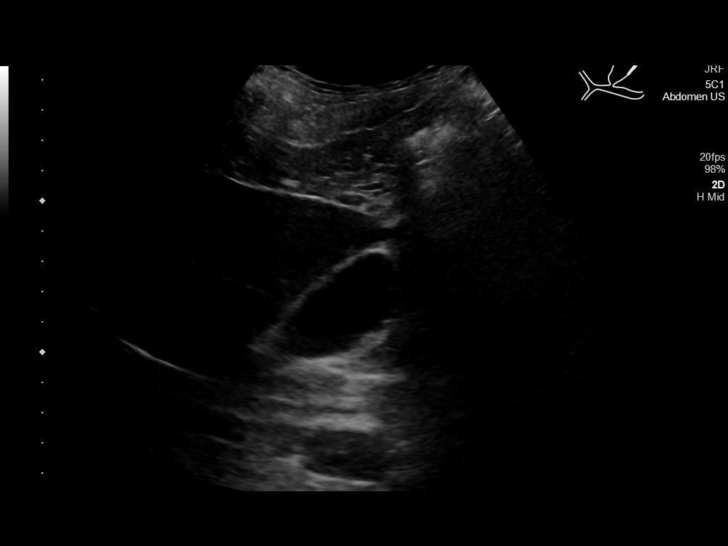
[im 10/37]
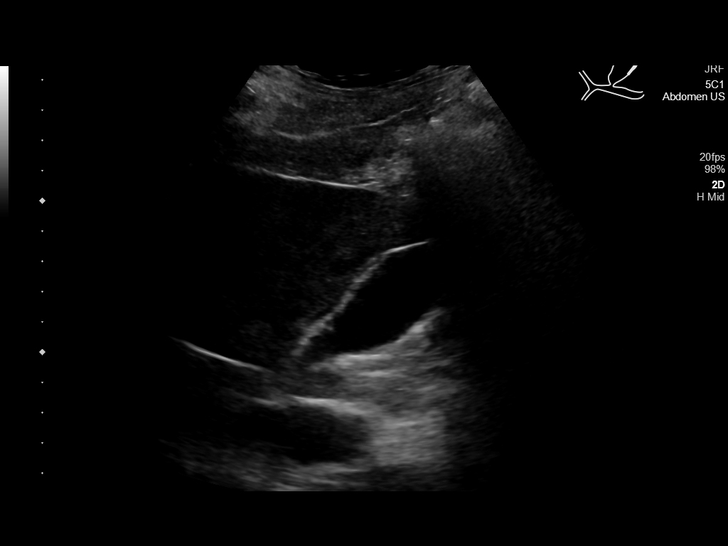
[im 13/37]
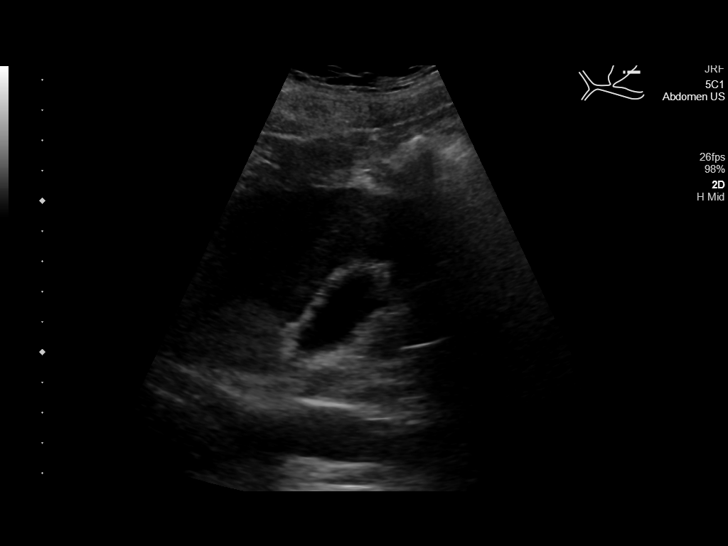
[im 14/37]
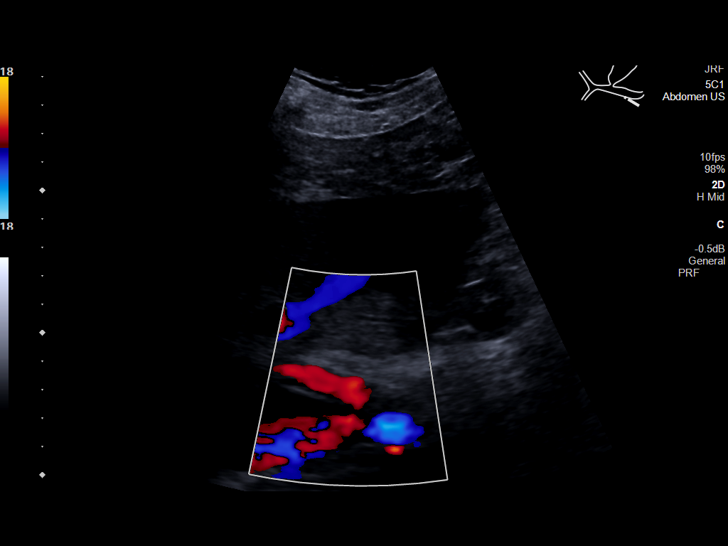
[im 17/37]
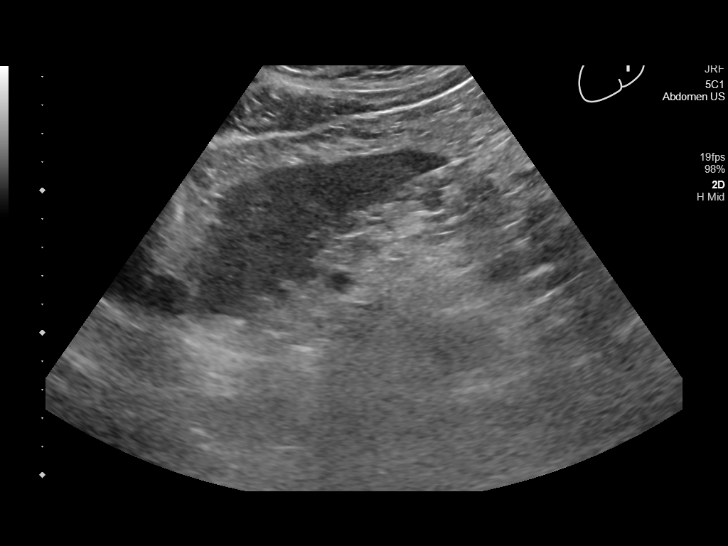
[im 20/37]
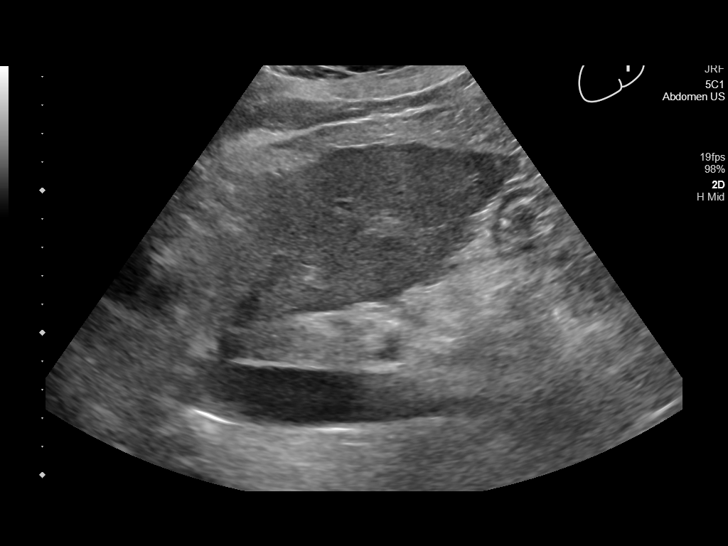
[im 23/37]
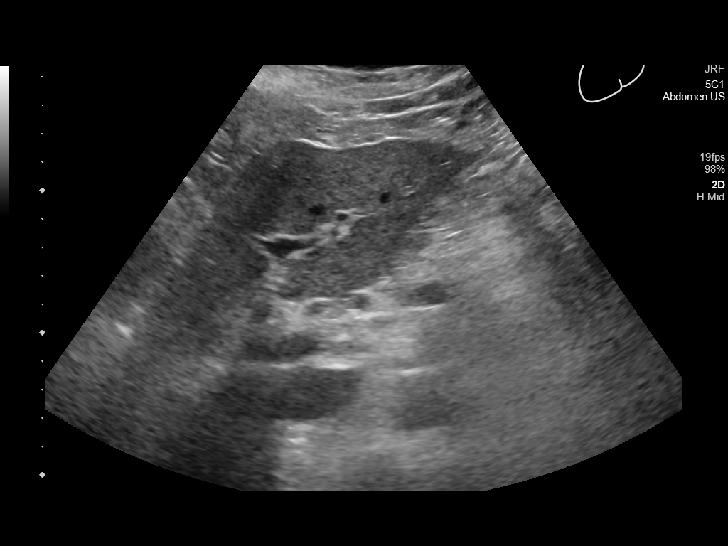
[im 25/37]
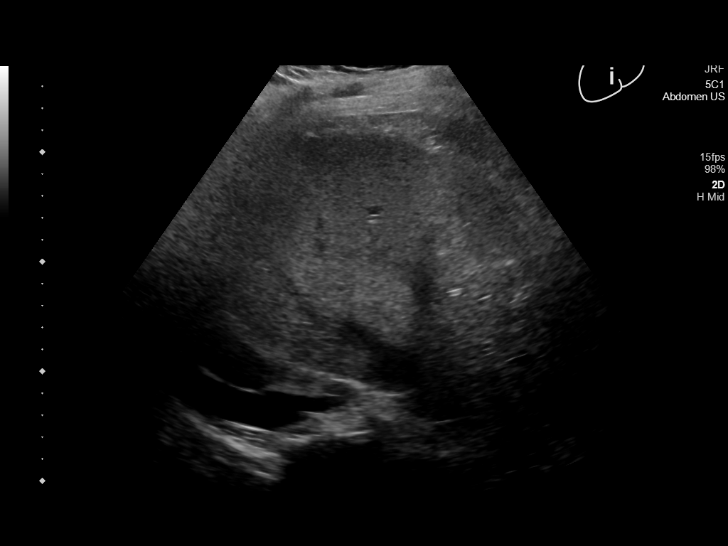
[im 28/37]
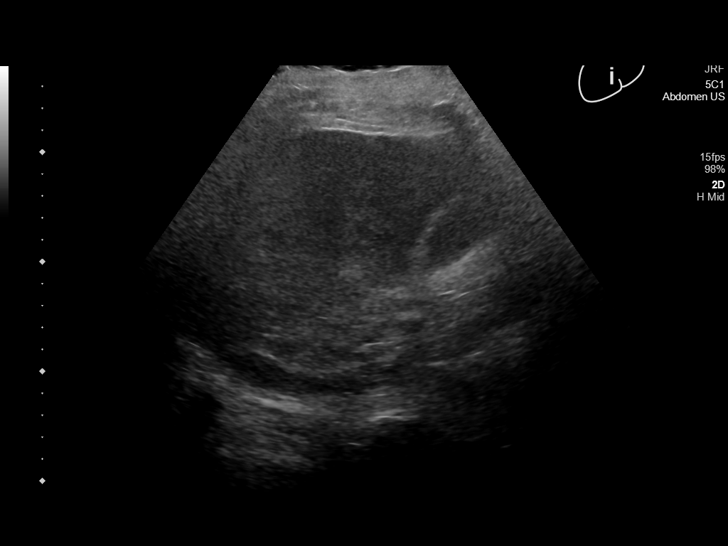
[im 31/37]
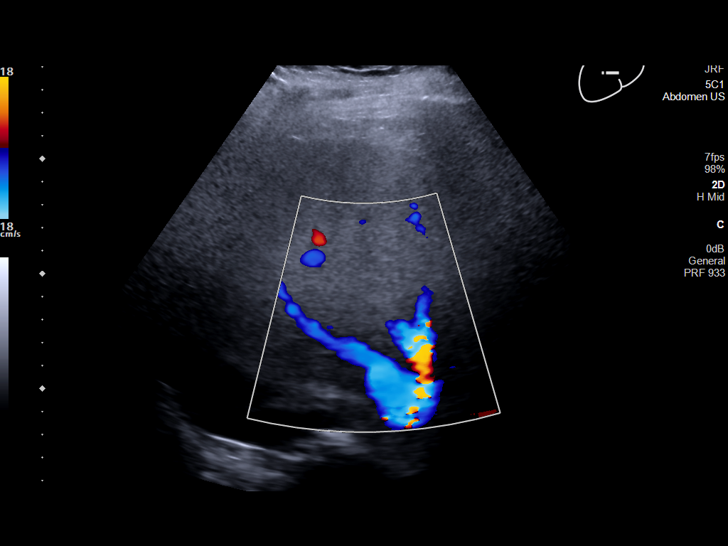
[im 34/37]
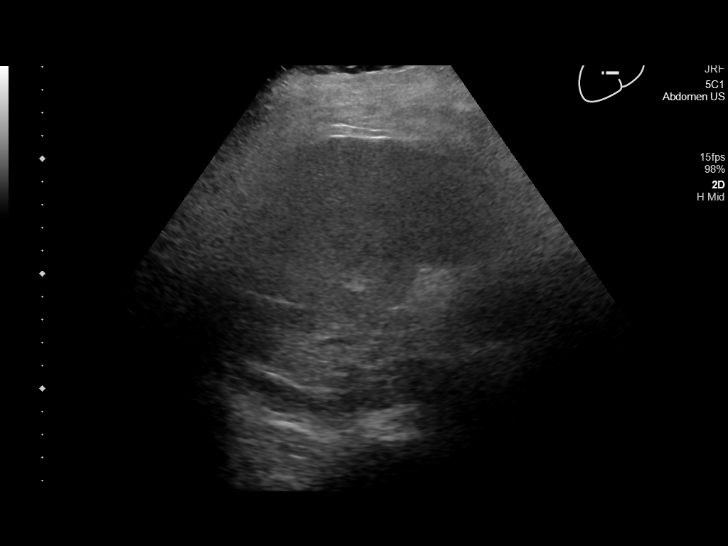
[im 37/37]
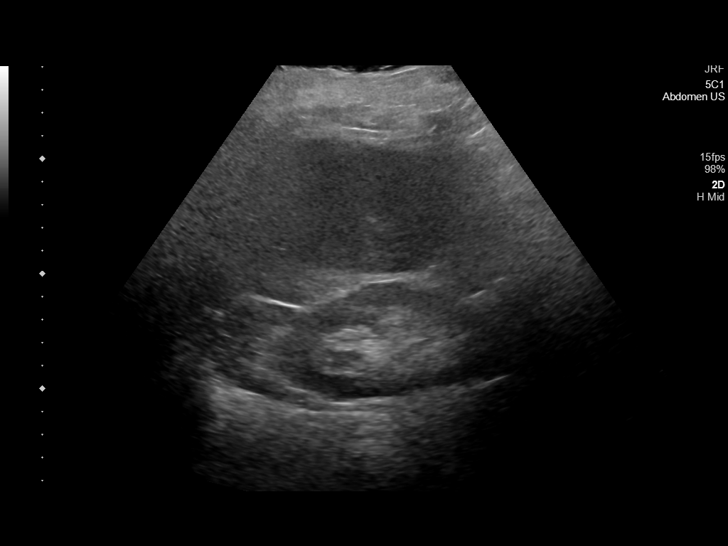

[14 of 25 positions shown; findings below may reference images not displayed]

FINDINGS: Gallbladder:

No gallstones or wall thickening visualized. No sonographic Murphy
sign noted by sonographer.

Common bile duct:

Diameter: 3 mm which is within normal limits.

Liver:

No focal lesion identified. Mildly increased echogenicity of hepatic
parenchyma is noted suggesting hepatic steatosis or other diffuse
hepatocellular disease. Portal vein is patent on color Doppler
imaging with normal direction of blood flow towards the liver.

Other: None.
IMPRESSION: Mildly increased echogenicity of hepatic parenchyma is noted
suggesting hepatic steatosis or other diffuse hepatocellular
disease. No other abnormality seen in the right upper quadrant of
the abdomen.

## 2021-04-11 ENCOUNTER — Encounter: Payer: Self-pay | Admitting: Emergency Medicine

## 2021-04-11 ENCOUNTER — Emergency Department
Admission: EM | Admit: 2021-04-11 | Discharge: 2021-04-11 | Disposition: A | Discharge status: Home / Self Care | Payer: Medicare PPO | Attending: Emergency Medicine | Admitting: Emergency Medicine

## 2021-04-11 ENCOUNTER — Emergency Department: Payer: Medicare PPO

## 2021-04-11 ENCOUNTER — Other Ambulatory Visit: Payer: Self-pay

## 2021-04-11 DIAGNOSIS — W19XXXA Unspecified fall, initial encounter: Secondary | ICD-10-CM

## 2021-04-11 DIAGNOSIS — M25421 Effusion, right elbow: Secondary | ICD-10-CM | POA: Diagnosis not present

## 2021-04-11 DIAGNOSIS — I509 Heart failure, unspecified: Secondary | ICD-10-CM | POA: Diagnosis not present

## 2021-04-11 DIAGNOSIS — I110 Hypertensive heart disease with heart failure: Secondary | ICD-10-CM | POA: Diagnosis not present

## 2021-04-11 DIAGNOSIS — M79601 Pain in right arm: Secondary | ICD-10-CM | POA: Diagnosis present

## 2021-04-11 DIAGNOSIS — J449 Chronic obstructive pulmonary disease, unspecified: Secondary | ICD-10-CM | POA: Insufficient documentation

## 2021-04-11 DIAGNOSIS — W182XXA Fall in (into) shower or empty bathtub, initial encounter: Secondary | ICD-10-CM | POA: Insufficient documentation

## 2021-04-11 MED ORDER — ACETAMINOPHEN 500 MG PO TABS
1000.0000 mg | ORAL_TABLET | Freq: Once | ORAL | Status: AC
Start: 2021-04-11 — End: 2021-04-11
  Administered 2021-04-11: 1000 mg via ORAL
  Filled 2021-04-11: qty 2

## 2021-04-11 MED ORDER — OXYCODONE HCL 5 MG PO TABS
5.0000 mg | ORAL_TABLET | Freq: Four times a day (QID) | ORAL | 0 refills | Status: AC | PRN
Start: 2021-04-11 — End: 2021-04-16

## 2021-04-11 NOTE — ED Notes (Signed)
See triage note  presents s/p fall   having pain to right side  shoulder,wrist and elbow  no deformity noted  good pulses  ambulates slowly to treatment room

## 2021-04-11 NOTE — Discharge Instructions (Addendum)
Follow up with ortho in 2-3 days and return to ER for any other concerns.  Take tylenol 1g every 8 hours and oxycodone for breakthrough pain.  IMPRESSION: 1. Large elbow joint effusion, nonspecific, but may represent a hemarthrosis secondary to an occult fracture in the setting of trauma. No clearly defined fracture line identified. 2. Moderate osteoarthritis of the elbow.  Take oxycodone as prescribed. Do not drink alcohol, drive or participate in any other potentially dangerous activities while taking this medication as it may make you sleepy. Do not take this medication with any other sedating medications, either prescription or over-the-counter. If you were prescribed Percocet or Vicodin, do not take these with acetaminophen (Tylenol) as it is already contained within these medications.  This medication is an opiate (or narcotic) pain medication and can be habit forming. Use it as little as possible to achieve adequate pain control. Do not use or use it with extreme caution if you have a history of opiate abuse or dependence. If you are on a pain contract with your primary care doctor or a pain specialist, be sure to let them know you were prescribed this medication today from the Emergency Department. This medication is intended for your use only - do not give any to anyone else and keep it in a secure place where nobody else, especially children, have access to it.

## 2021-04-11 NOTE — ED Triage Notes (Signed)
Fall this morning.  C/O right shoulder and arm pain.

## 2021-04-11 NOTE — ED Notes (Signed)
Splint applied, good pulses.  Pt educated on follow up and care.

## 2021-04-11 NOTE — ED Provider Notes (Signed)
[facility] Provider Note    Event Date/Time   First MD Initiated Contact with Patient 04/11/21 1001     (approximate)   History   Fall   HPI  Albert Johnson is a 71 y.o. male with CHF, COPD, hypertension, hyperlipidemia who comes in with concerns for right arm pain.  Patient reports having mechanical fall where he was in the shower he slipped onto his right arm.  He not hit his head did not lose consciousness.  Denies any pain elsewhere just pain in the right arm worse with movement.  He reports the pain goes from his shoulder all the way down to his wrist.  Denies any pain in his hand.  Denies any other injuries or chest wall pain or abdominal pain   Physical Exam   Triage Vital Signs: ED Triage Vitals  Enc Vitals Group     BP 04/11/21 0946 (!) 185/89     Pulse Rate 04/11/21 0946 70     Resp 04/11/21 0946 18     Temp 04/11/21 0946 99 F (37.2 C)     Temp Source 04/11/21 0946 Oral     SpO2 04/11/21 0946 99 %     Weight 04/11/21 0939 298 lb 15.1 oz (135.6 kg)     Height 04/11/21 0939 5\' 11"  (1.803 m)     Head Circumference --      Peak Flow --      Pain Score 04/11/21 0938 6     Pain Loc --      Pain Edu? --      Excl. in [place]? --     Most recent vital signs: Vitals:   04/11/21 0946  BP: (!) 185/89  Pulse: 70  Resp: 18  Temp: 99 F (37.2 C)  SpO2: 99%     General: Awake, no distress.  No evidence of head trauma. CV:  Good peripheral perfusion.  Resp:  Normal effort.  Abd:  No distention.  Other:  Right elbow with some swelling and limited range of motion secondary to pain.  Radial pulse intact.  Radial, ulnar, median nerve intact.  Sensation intact in all of his digits.  No abrasions noted   ED Results / Procedures / Treatments    RADIOLOGY I have reviewed the xray personally and agree with radiology read x-ray with no fractures but concern for effusion   PROCEDURES:  Critical Care performed: No  Procedures   MEDICATIONS  ORDERED IN ED: Medications - No data to display   IMPRESSION / MDM / [place] / ED COURSE  I reviewed the triage vital signs and the nursing notes.  Differential diagnosis includes, but is not limited to, fracture versus dislocation versus contusion.  No other injuries on examination, did not hit his head unlikely intercranial hemorrhage.  X-ray concerning for effusion but no evidence of fracture.  Patient placed in splint and sling and will follow-up with orthopedics.  Given patient's pain we discussed some oxycodone for pain and not to drive or work while on this.  Patient did any pain prior to the fall do not feel this is a septic joint.  This all happened after the fall which makes me concerned for an occult fracture  Patient will follow up with Dr. Harlow Mares outpatient.  I reviewed the database and patient had been prescribed opioids previously but not had a prescription since April 2022 given the new concern for fracture patient will be placed on a short course of  oxycodone.     FINAL CLINICAL IMPRESSION(S) / ED DIAGNOSES   Final diagnoses:  Fall  Fall, initial encounter  Elbow effusion, right     Rx / DC Orders   ED Discharge Orders          Ordered    oxyCODONE (ROXICODONE) 5 MG immediate release tablet  Every 6 hours PRN        04/11/21 1238             Note:  This document was prepared using Dragon voice recognition software and may include unintentional dictation errors.   Vanessa Newport, MD 04/11/21 1247

## 2021-08-10 ENCOUNTER — Ambulatory Visit
Admission: EM | Admit: 2021-08-10 | Discharge: 2021-08-10 | Disposition: A | Discharge status: Home / Self Care | Payer: Medicare Other | Attending: Emergency Medicine | Admitting: Emergency Medicine

## 2021-08-10 DIAGNOSIS — R079 Chest pain, unspecified: Secondary | ICD-10-CM | POA: Diagnosis not present

## 2021-08-10 NOTE — ED Notes (Signed)
Patient is being discharged from the Urgent Care and sent to the Emergency Department via personal vehicle . Per White, NP, patient is in need of higher level of care due to chest pain. Patient is aware and verbalizes understanding of plan of care.  Vitals:   08/10/21 1134  BP: (S) (!) 178/97  Pulse: 69  Resp: 18  Temp: 97.9 F (36.6 C)  SpO2: 100%

## 2021-08-10 NOTE — ED Provider Notes (Signed)
[facility]    CSN: [id number] Arrival date & time: 08/10/21  1127      History   Chief Complaint Chief Complaint  Patient presents with   Chest Pain    HPI Albert Johnson is a 71 y.o. male.   Patient presents with left-sided chest pain described as pressure beginning at approximately 8 AM this morning.  Symptoms have been constant.  Pain does not radiate.  Associated bilateral eye pain and blurred vision and lightheadedness.  Has taken 381 mg aspirins.  History of CHF, COPD, hyperlipidemia, hypertension, obstructive sleep apnea.  History of CABG.  Daily smoker.  Denies shortness of breath, dizziness, abdominal pain, vomiting.   Past Medical History:  Diagnosis Date   Arthritis    CHF (congestive heart failure) (HCC)    COPD (chronic obstructive pulmonary disease) (HCC)    Hyperlipidemia    Hypertension    Renal disorder     Patient Active Problem List   Diagnosis Date Noted   Obstructive sleep apnea syndrome    Hepatic steatosis    Hypertensive heart disease with heart failure (HCC) 07/31/2019   Acute diastolic CHF (congestive heart failure) (HCC)    Hyperlipidemia    Gastroesophageal reflux disease without esophagitis    Weakness    Acute CHF (congestive heart failure) (HCC) 07/30/2019   Hypertension    COPD (chronic obstructive pulmonary disease) (HCC)    Nicotine dependence    Acute respiratory failure with hypoxia (HCC)     Past Surgical History:  Procedure Laterality Date   CORONARY ARTERY BYPASS GRAFT     REPLACEMENT TOTAL KNEE         Home Medications    Prior to Admission medications   Medication Sig Start Date End Date Taking? Authorizing Provider  albuterol (VENTOLIN HFA) 108 (90 Base) MCG/ACT inhaler INHALE 2 PUFFS BY MOUTH EVERY 6 HOURS AS NEEDED FOR WHEEZING 11/28/19   [provider]  aspirin 81 MG EC tablet Take by mouth. 05/27/18   [provider]  atorvastatin (LIPITOR) 10 MG tablet Take 10 mg by mouth daily.     [provider]  carvedilol (COREG) 6.25 MG tablet Take 1 tablet (6.25 mg total) by mouth 2 (two) times daily. 10/13/19   Debbe Odea, MD  furosemide (LASIX) 40 MG tablet Take 1 tablet (40 mg total) by mouth 2 (two) times daily. Take 1 tablet (40mg ) once a day as needed. 12/02/19   12/04/19, MD  gabapentin (NEURONTIN) 300 MG capsule Take 300 mg by mouth 3 (three) times daily.  10/21/19   [provider]  hydrALAZINE (APRESOLINE) 100 MG tablet Take 1 tablet (100 mg total) by mouth 3 (three) times daily. Dosage increase 10/13/19 01/11/20  01/13/20, MD  IBUPROFEN PO Take by mouth as needed.    [provider]  isosorbide mononitrate (IMDUR) 60 MG 24 hr tablet Take 1 tablet (60 mg total) by mouth daily. 12/15/19   12/17/19, MD  losartan (COZAAR) 100 MG tablet Take 1 tablet (100 mg total) by mouth daily. 08/02/19   08/04/19, MD  omeprazole (PRILOSEC) 20 MG capsule Take 20 mg by mouth daily.    [provider]  oxyCODONE ER (XTAMPZA ER) 9 MG C12A TAKE ONE CAPSULE BY MOUTH DAILY FOR THE FIRST 3 DAYS IF TOLERATED THEN 1 CAPSULE EVERY 12 HOURS IF TOLERATED. NO BUTRANS OR BUPRENORPHINE 11/21/19   [provider]  VITAMIN D PO Take by mouth daily.    [provider]    Family History History reviewed. No pertinent family history.  Social History Social History   Tobacco Use   Smoking status: Every Day    Packs/day: 0.50    Types: Cigarettes   Smokeless tobacco: Never  Vaping Use   Vaping Use: Never used  Substance Use Topics   Alcohol use: No   Drug use: No     Allergies   Patient has no known allergies.   Review of Systems Review of Systems  Cardiovascular:  Positive for chest pain.    Physical Exam Triage Vital Signs ED Triage Vitals  Enc Vitals Group     BP 08/10/21 1134 (S) (!) 178/97     Pulse Rate 08/10/21 1134 69     Resp 08/10/21 1134 18     Temp 08/10/21 1134 97.9 F (36.6 C)      Temp Source 08/10/21 1134 Oral     SpO2 08/10/21 1134 100 %     Weight --      Height --      Head Circumference --      Peak Flow --      Pain Score 08/10/21 1133 8     Pain Loc --      Pain Edu? --      Excl. in [place]? --    No data found.  Updated Vital Signs BP (S) (!) 178/97 (BP Location: Left Arm) Comment: did not take BP med.  Pulse 69   Temp 97.9 F (36.6 C) (Oral)   Resp 18   SpO2 100%   Visual Acuity Right Eye Distance:   Left Eye Distance:   Bilateral Distance:    Right Eye Near:   Left Eye Near:    Bilateral Near:     Physical Exam   UC Treatments / Results  Labs (all labs ordered are listed, but only abnormal results are displayed) Labs Reviewed - No data to display  EKG   Radiology No results found.  Procedures Procedures (including critical care time)  Medications Ordered in UC Medications - No data to display  Initial Impression / Assessment and Plan / UC Course  I have reviewed the triage vital signs and the nursing notes.  Pertinent labs & imaging results that were available during my care of the patient were reviewed by me and considered in my medical decision making (see chart for details).  Chest pain  Blood pressure elevated at 178/97 in triage, patient is having active chest pain, EKG showing normal sinus rhythm, due to current symptoms and medical history patient sent to the nearest emergency department for further evaluation and management, as EKG shows no emergent rhythm patient to be escorted by friend Final Clinical Impressions(s) / UC Diagnoses   Final diagnoses:  Chest pain, unspecified type     Discharge Instructions      Based on your medical history please go to the nearest emergency department for further evaluation and monitoring of your heart  Your EKG shows that your heart is beating at a normal pace and rhythm at this time however this is only showing 6 seconds and here in the urgent care setting I am unable to  rule out additional causes that may be causing your pain    ED Prescriptions   None    PDMP not reviewed this encounter.   Valinda Hoar, NP 08/10/21 [phone]

## 2021-08-10 NOTE — ED Triage Notes (Signed)
Patient presents to Urgent Care with complaints of chest pain, dizziness, and SOB since this morning at about 0800. Pt states pain is located to left of chest. Pt took 3 (81 mg) ASA at 0830.

## 2021-08-10 NOTE — Discharge Instructions (Signed)
Based on your medical history please go to the nearest emergency department for further evaluation and monitoring of your heart  Your EKG shows that your heart is beating at a normal pace and rhythm at this time however this is only showing 6 seconds and here in the urgent care setting I am unable to rule out additional causes that may be causing your pain

## 2022-10-24 IMAGING — CR DG SHOULDER 2+V*R*
1 series · 3 of 3 positions shown · non-contrast
Comparison: 11/16/2016

CLINICAL DATA: Right shoulder pain

EXAM:
RIGHT SHOULDER - 2+ VIEW

[Series 1: dg shoulder right · 0.14mm/px · 3 of 3 slices shown]
[im 1/3]
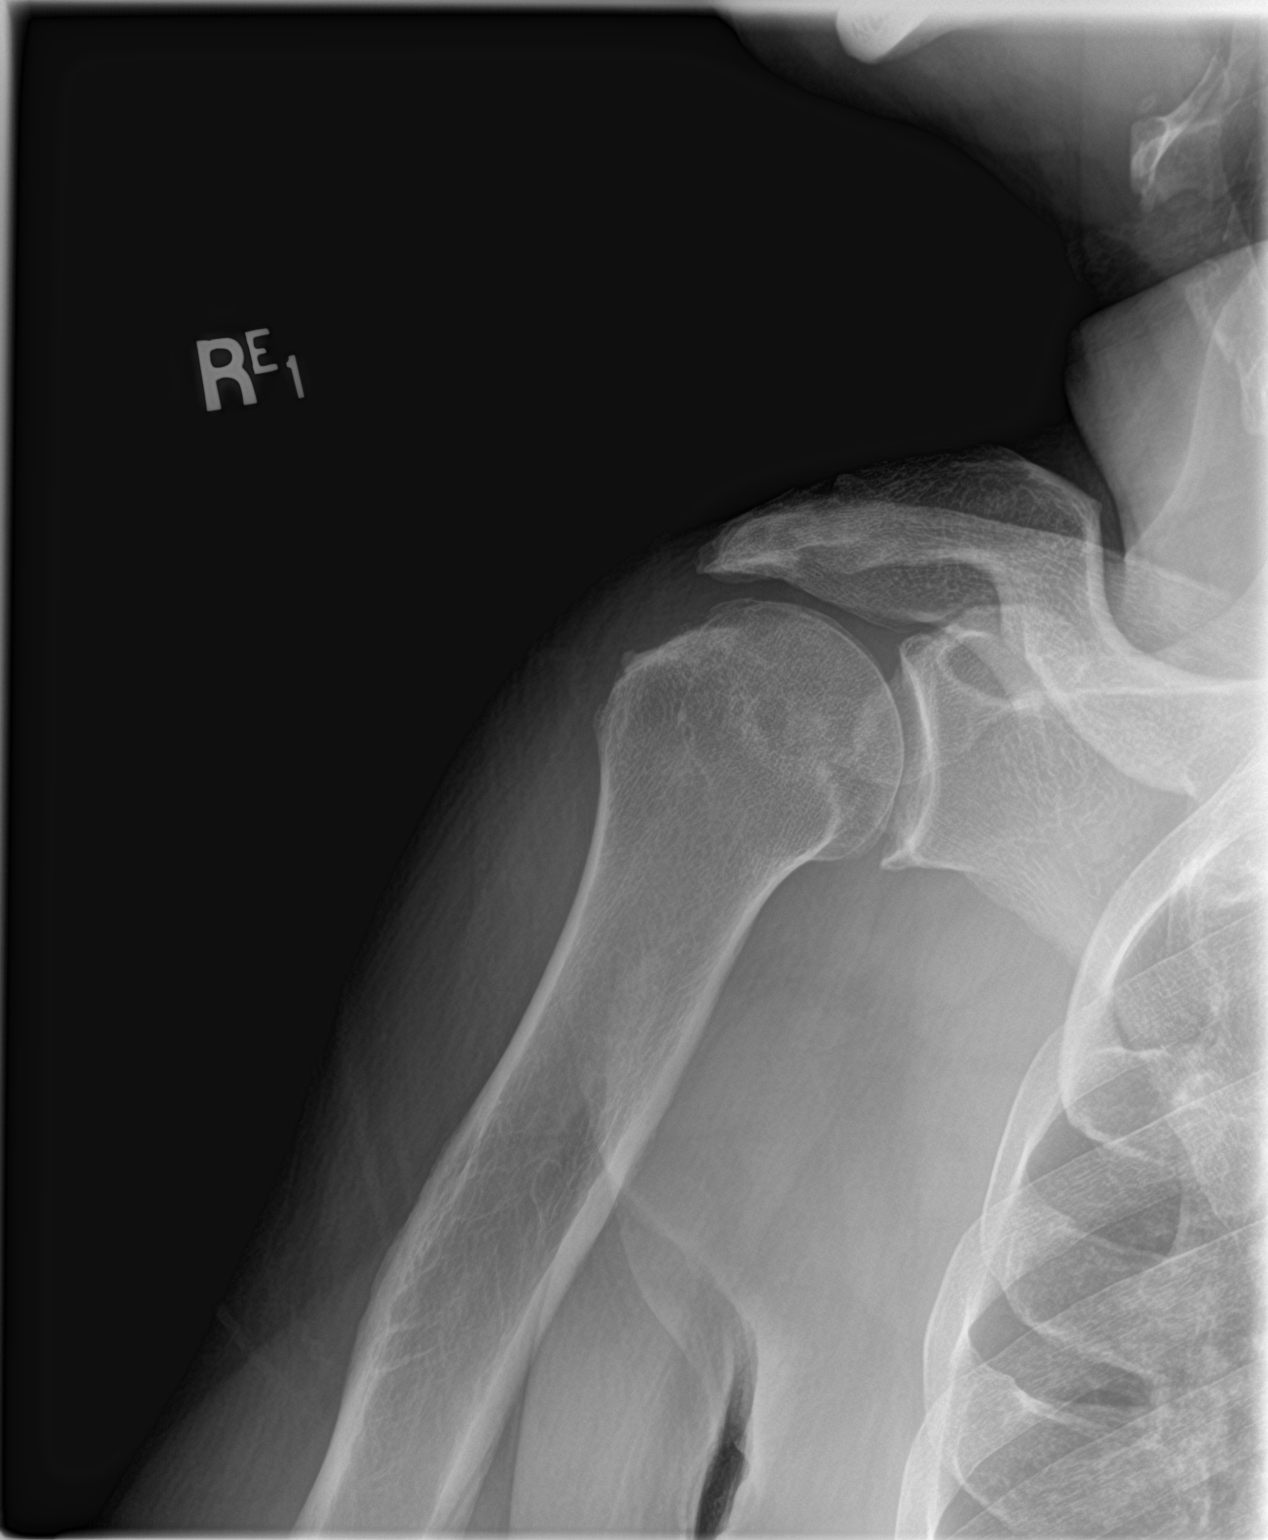
[im 2/3]
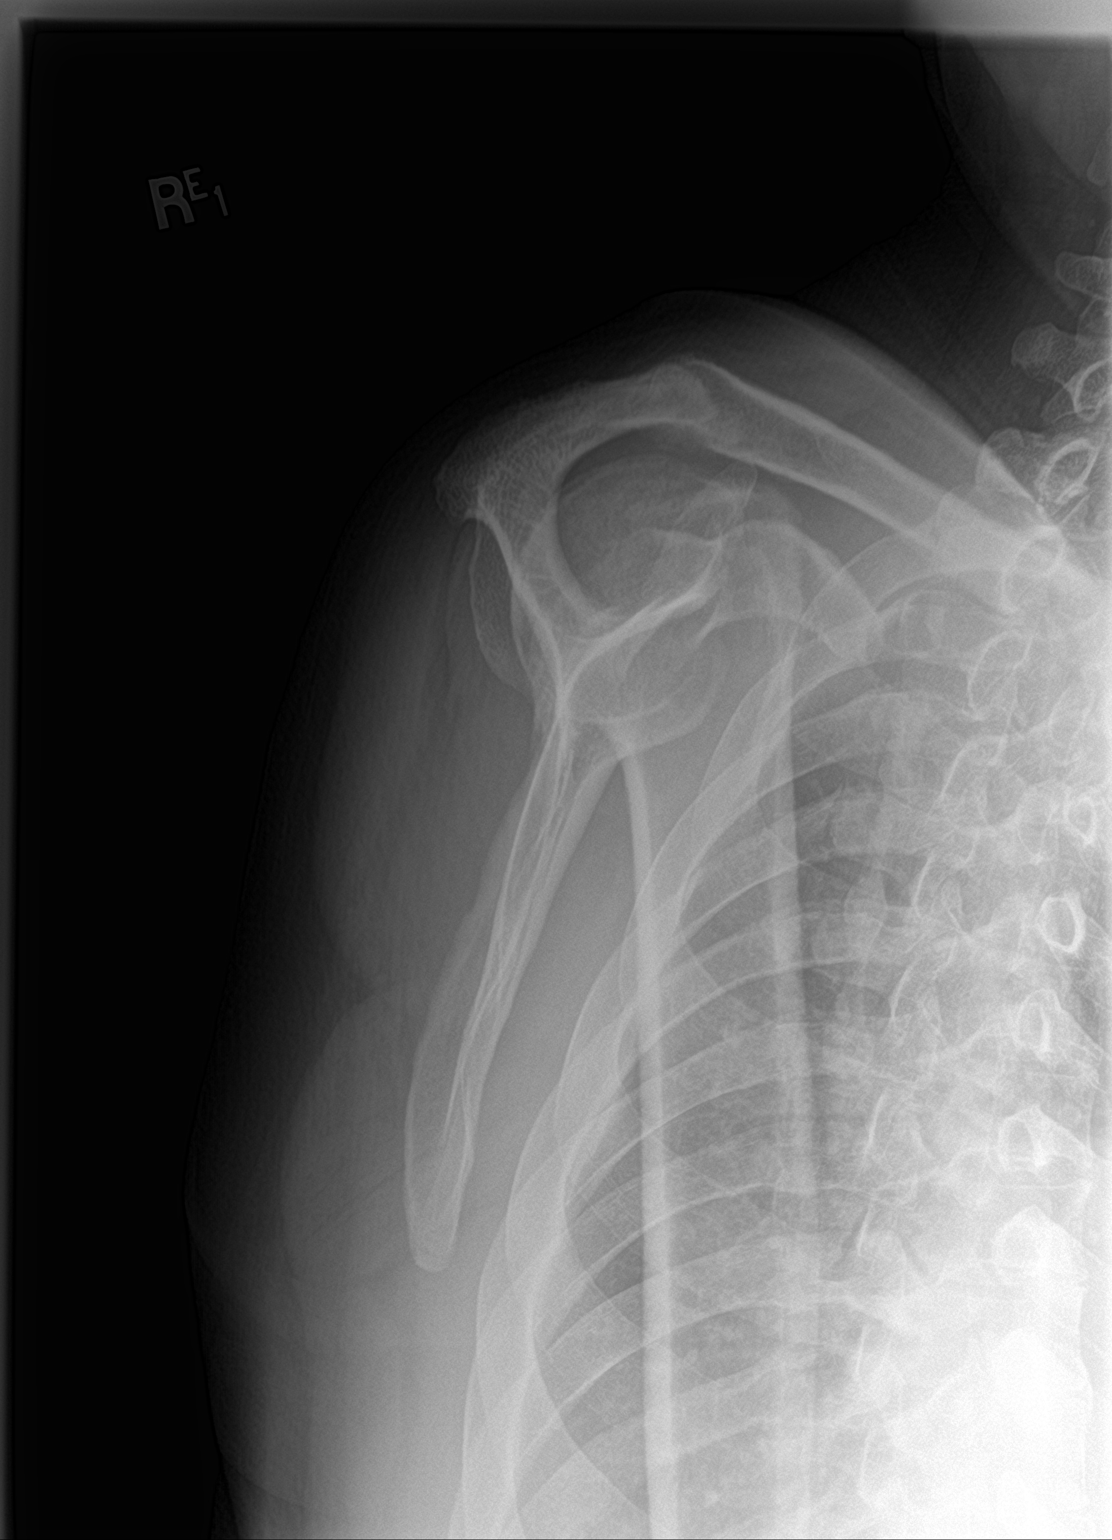
[im 3/3]
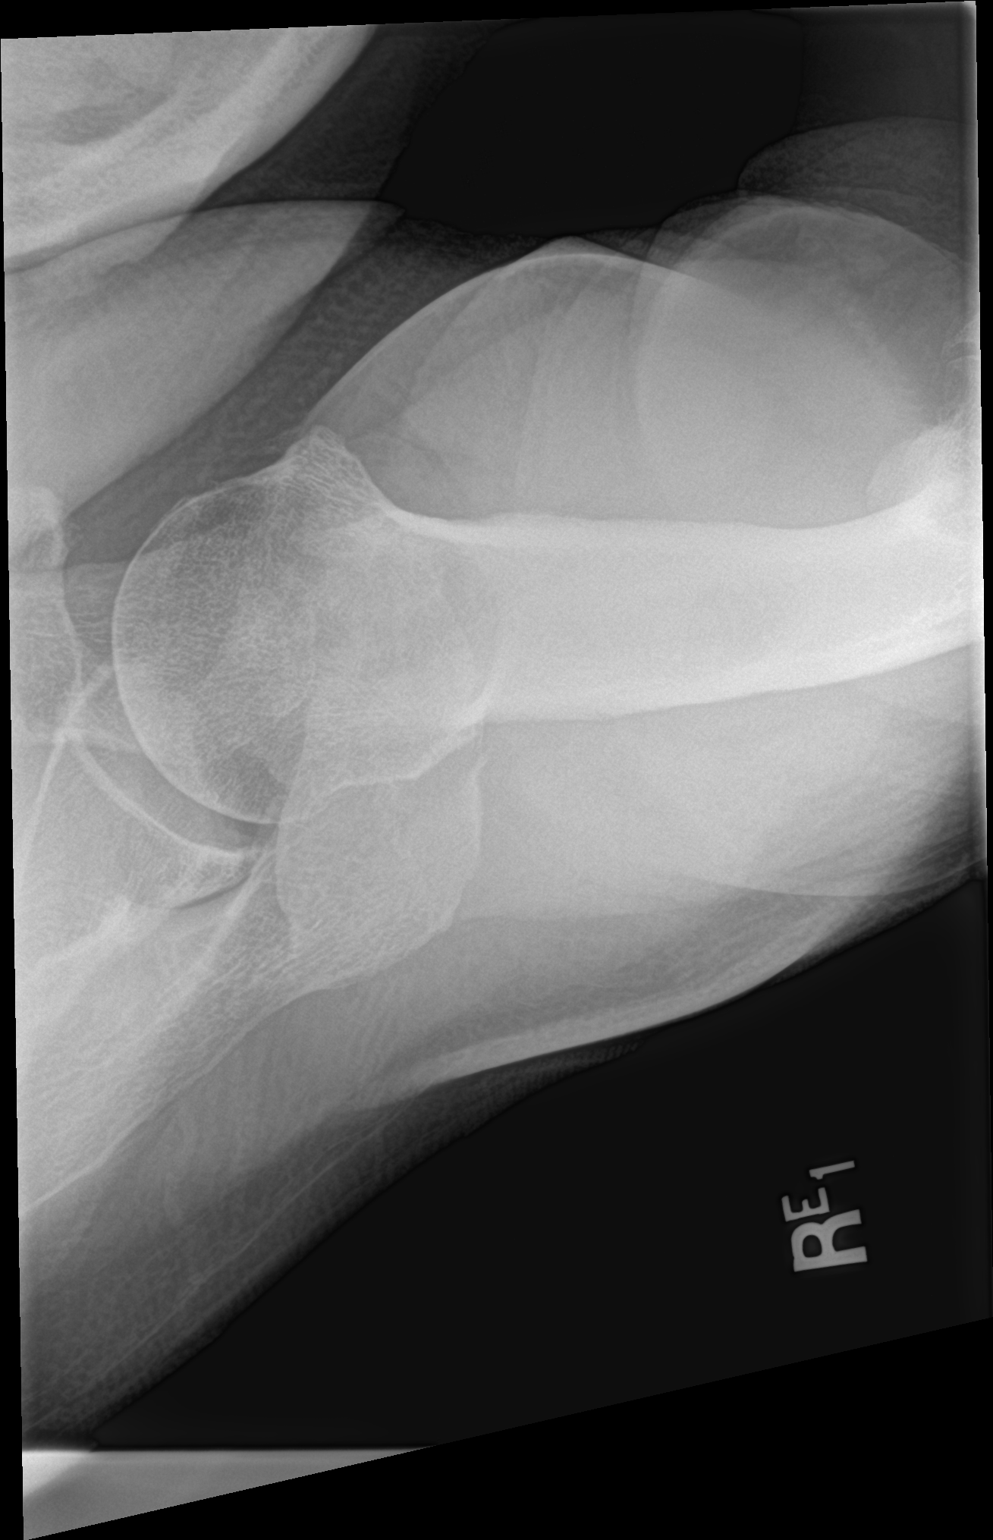

[3 of 3 positions shown; findings below may reference images not displayed]

FINDINGS: There is no evidence of fracture or dislocation. Mild degenerative
changes involving the glenohumeral and acromioclavicular joints.
Soft tissues are unremarkable.
IMPRESSION: Negative.
